# Patient Record
Sex: Female | Born: 1970
Health system: Southern US, Community
[De-identification: ages and names within clinical notes are randomized; demographics above are authoritative.]

## PROBLEM LIST (undated history)

## (undated) DIAGNOSIS — F419 Anxiety disorder, unspecified: Secondary | ICD-10-CM

## (undated) DIAGNOSIS — T7840XA Allergy, unspecified, initial encounter: Secondary | ICD-10-CM

## (undated) DIAGNOSIS — F32A Depression, unspecified: Secondary | ICD-10-CM

## (undated) HISTORY — DX: Allergy, unspecified, initial encounter: T78.40XA

## (undated) HISTORY — DX: Anxiety disorder, unspecified: F41.9

## (undated) HISTORY — PX: WISDOM TOOTH EXTRACTION: SHX21

## (undated) HISTORY — PX: URETHRAL CYST REMOVAL: SHX5128

## (undated) HISTORY — PX: OTHER SURGICAL HISTORY: SHX169

## (undated) HISTORY — DX: Depression, unspecified: F32.A

---

## 2005-06-16 ENCOUNTER — Encounter: Payer: Self-pay | Admitting: Family Medicine

## 2005-06-16 LAB — CONVERTED CEMR LAB

## 2006-06-22 ENCOUNTER — Ambulatory Visit: Payer: Self-pay | Admitting: Family Medicine

## 2006-06-26 ENCOUNTER — Ambulatory Visit: Payer: Self-pay | Admitting: Family Medicine

## 2006-06-28 ENCOUNTER — Ambulatory Visit: Payer: Self-pay | Admitting: Family Medicine

## 2006-08-07 ENCOUNTER — Ambulatory Visit: Payer: Self-pay | Admitting: Family Medicine

## 2007-01-21 ENCOUNTER — Ambulatory Visit: Payer: Self-pay | Admitting: Family Medicine

## 2007-02-04 ENCOUNTER — Telehealth: Payer: Self-pay | Admitting: Family Medicine

## 2007-02-06 ENCOUNTER — Ambulatory Visit: Payer: Self-pay | Admitting: Family Medicine

## 2007-05-24 ENCOUNTER — Ambulatory Visit: Payer: Self-pay | Admitting: Family Medicine

## 2007-05-24 DIAGNOSIS — A630 Anogenital (venereal) warts: Secondary | ICD-10-CM | POA: Insufficient documentation

## 2007-10-04 ENCOUNTER — Ambulatory Visit: Payer: Self-pay | Admitting: Family Medicine

## 2007-10-04 LAB — CONVERTED CEMR LAB: Rapid Strep: NEGATIVE

## 2007-11-20 ENCOUNTER — Ambulatory Visit: Payer: Self-pay | Admitting: Family Medicine

## 2007-11-20 DIAGNOSIS — K644 Residual hemorrhoidal skin tags: Secondary | ICD-10-CM | POA: Insufficient documentation

## 2009-07-21 ENCOUNTER — Ambulatory Visit: Payer: Self-pay | Admitting: Family Medicine

## 2010-07-27 ENCOUNTER — Encounter: Payer: Self-pay | Admitting: Family Medicine

## 2010-11-15 NOTE — Letter (Signed)
Summary: [facility]  [facility]   Imported By: Lanelle Bal 08/11/2010 09:24:05  _____________________________________________________________________  External Attachment:    Type:   Image     Comment:   External Document

## 2010-11-30 ENCOUNTER — Encounter: Payer: Self-pay | Admitting: Family Medicine

## 2010-11-30 ENCOUNTER — Ambulatory Visit (INDEPENDENT_AMBULATORY_CARE_PROVIDER_SITE_OTHER): Payer: BC Managed Care – PPO | Admitting: Family Medicine

## 2010-11-30 DIAGNOSIS — H9209 Otalgia, unspecified ear: Secondary | ICD-10-CM

## 2010-12-07 NOTE — Assessment & Plan Note (Signed)
Summary: Ear pain   Vital Signs:  Patient profile:   40 year old female Height:      67 inches Weight:      143 pounds Temp:     98.0 degrees F oral Pulse rate:   69 / minute BP sitting:   111 / 70  (right arm) Cuff size:   regular  Vitals Entered By: Avon Gully CMA, Duncan Dull) (November 30, 2010 11:27 AM) CC: left ear pain x 2 days   Primary Care Provider:  Linford Arnold, C  CC:  left ear pain x 2 days.  History of Present Illness: Left ear feels sore. Worse later inthe day. Worse at night when lays down at night. No URI. Mild ST. No ear drainage.  No sneezing. No fever Hx of allergies.  Did take a benadrul last night and a sudafed this AM. Not sure if really helped or not.   Current Medications (verified): 1)  Multivitamins Tabs (Multiple Vitamin) .... Take 1 Tablet By Mouth Once A Day 2)  Fish Oil 3)  Vit D 4)  Vit C 5)  Calcium  Allergies (verified): No Known Drug Allergies  Comments:  Nurse/Medical Assistant: The patient's medications and allergies were reviewed with the patient and were updated in the Medication and Allergy Lists. Avon Gully CMA, Duncan Dull) (November 30, 2010 11:28 AM)  Physical Exam  General:  Well-developed,well-nourished,in no acute distress; alert,appropriate and cooperative throughout examination Head:  Normocephalic and atraumatic without obvious abnormalities. No apparent alopecia or balding. Eyes:  No corneal or conjunctival inflammation noted. EOMI. Perrla.  Ears:  External ear exam shows no significant lesions or deformities.  Otoscopic examination reveals clear canals, tympanic membranes are intact bilaterally without bulging, retraction, inflammation or discharge.Left TM with small amout of clear fluid.  Hearing is grossly normal bilaterally. Nose:  External nasal examination shows no deformity or inflammation. Nasal mucosa are pink and moist without lesions or exudates. Mouth:  Oral mucosa and oropharynx without lesions or exudates.   Teeth in good repair. Neck:  No deformities, masses, or tenderness noted. Lungs:  Normal respiratory effort, chest expands symmetrically. Lungs are clear to auscultation, no crackles or wheezes. Heart:  Normal rate and regular rhythm. S1 and S2 normal without gallop, murmur, click, rub or other extra sounds. Pulses:  Raidal 2+  Skin:  no rashes.   Cervical Nodes:  Small right anterior cerv LN.  Psych:  Cognition and judgment appear intact. Alert and cooperative with normal attention span and concentration. No apparent delusions, illusions, hallucinations   Impression & Recommendations:  Problem # 1:  EAR PAIN, LEFT (ICD-388.70) Eustachian tube dysfunction likely related to her allergies. Restart antihistamin and start a nasal steroid spray. Call if not better in one week or if suddently gets worse.   Complete Medication List: 1)  Multivitamins Tabs (Multiple vitamin) .... Take 1 tablet by mouth once a day 2)  Fish Oil  3)  Vit D  4)  Vit C  5)  Calcium  6)  Fluticasone Propionate 50 Mcg/act Susp (Fluticasone propionate) .... 2 sprays in each nostril daily  Patient Instructions: 1)  Trial of the nasal spray. 2 sprays in each nostril daily for 2 weeks 2)  Can also start claritin or zyrtec once a day as well.  3)  Call if gets worse.  Prescriptions: FLUTICASONE PROPIONATE 50 MCG/ACT SUSP (FLUTICASONE PROPIONATE) 2 sprays in each nostril daily  #1 x 0   Entered and Authorized by:   Nani Gasser MD  Signed by:   Nani Gasser MD on 11/30/2010   Method used:   Electronically to        [facility]* (retail)       [address] [place], [place]  [address]       Ph: [phone]       Fax: [phone]   RxID:   [id number]    Orders Added: 1)  Est. Patient Level III [66063]

## 2012-09-27 ENCOUNTER — Ambulatory Visit (INDEPENDENT_AMBULATORY_CARE_PROVIDER_SITE_OTHER): Payer: Managed Care, Other (non HMO) | Admitting: Sports Medicine

## 2012-09-27 DIAGNOSIS — Z111 Encounter for screening for respiratory tuberculosis: Secondary | ICD-10-CM

## 2012-09-27 NOTE — Progress Notes (Signed)
PPD planted today.  I was present for all essential parts of this visit and procedure. Ihor Austin. Benjamin Stain, M.D.

## 2013-01-06 ENCOUNTER — Encounter: Payer: Self-pay | Admitting: Family Medicine

## 2013-01-06 ENCOUNTER — Ambulatory Visit (INDEPENDENT_AMBULATORY_CARE_PROVIDER_SITE_OTHER): Payer: Managed Care, Other (non HMO) | Admitting: Family Medicine

## 2013-01-06 VITALS — BP 110/62 | HR 79 | Temp 97.9°F | Ht 67.0 in | Wt 138.0 lb

## 2013-01-06 DIAGNOSIS — K5289 Other specified noninfective gastroenteritis and colitis: Secondary | ICD-10-CM

## 2013-01-06 DIAGNOSIS — K529 Noninfective gastroenteritis and colitis, unspecified: Secondary | ICD-10-CM

## 2013-01-06 MED ORDER — ONDANSETRON 4 MG PO TBDP: 4.0000 mg | ORAL_TABLET | Freq: Three times a day (TID) | ORAL | Status: DC | PRN

## 2013-01-06 NOTE — Progress Notes (Signed)
  Subjective:    Patient ID: Ruth Garcia, female    DOB: 06-Apr-1971, 42 y.o.   MRN: [id number]  HPI N/V/D started at 1 AM.  Some chills. Took 2 TUMS but no relief.  Took some pepto-bismol.  Stomach and throat is sore.  Some epigastric discomfort.  Took HA.     Review of Systems     Objective:   Physical Exam  Constitutional: She is oriented to person, place, and time. She appears well-developed and well-nourished.  HENT:  Head: Normocephalic and atraumatic.  Right Ear: External ear normal.  Left Ear: External ear normal.  Nose: Nose normal.  Mouth/Throat: Oropharynx is clear and moist.  TMs and canals are clear.   Eyes: Conjunctivae and EOM are normal. Pupils are equal, round, and reactive to light.  Neck: Neck supple. No thyromegaly present.  Cardiovascular: Normal rate, regular rhythm and normal heart sounds.   Pulmonary/Chest: Effort normal and breath sounds normal. She has no wheezes.  Abdominal: Soft. Bowel sounds are normal. She exhibits no distension and no mass. There is tenderness. There is no rebound and no guarding.  Diffuse tenderness.   Musculoskeletal: She exhibits no edema.  Lymphadenopathy:    She has no cervical adenopathy.  Neurological: She is alert and oriented to person, place, and time.  Skin: Skin is warm and dry.  Psychiatric: She has a normal mood and affect.          Assessment & Plan:  Gastroenteritis. Likely viral. Symptoms aren't completely classic. Handout given. Difference in to the pharmacy for as needed for nausea. Encouraged her not to take anything for the diarrhea such as Imodium if they can oftentimes prolonged illness. Make sure it's starting to increase fluids today and eat when able. Advance diet as tolerated. Call if not better in 2-3 days.

## 2013-01-06 NOTE — Patient Instructions (Signed)
Viral Gastroenteritis Viral gastroenteritis is also known as stomach flu. This condition affects the stomach and intestinal tract. It can cause sudden diarrhea and vomiting. The illness typically lasts 3 to 8 days. Most people develop an immune response that eventually gets rid of the virus. While this natural response develops, the virus can make you quite ill. CAUSES  Many different viruses can cause gastroenteritis, such as rotavirus or noroviruses. You can catch one of these viruses by consuming contaminated food or water. You may also catch a virus by sharing utensils or other personal items with an infected person or by touching a contaminated surface. SYMPTOMS  The most common symptoms are diarrhea and vomiting. These problems can cause a severe loss of body fluids (dehydration) and a body salt (electrolyte) imbalance. Other symptoms may include:  Fever.  Headache.  Fatigue.  Abdominal pain. DIAGNOSIS  Your caregiver can usually diagnose viral gastroenteritis based on your symptoms and a physical exam. A stool sample may also be taken to test for the presence of viruses or other infections. TREATMENT  This illness typically goes away on its own. Treatments are aimed at rehydration. The most serious cases of viral gastroenteritis involve vomiting so severely that you are not able to keep fluids down. In these cases, fluids must be given through an intravenous line (IV). HOME CARE INSTRUCTIONS   Drink enough fluids to keep your urine clear or pale yellow. Drink small amounts of fluids frequently and increase the amounts as tolerated.  Ask your caregiver for specific rehydration instructions.  Avoid:  Foods high in sugar.  Alcohol.  Carbonated drinks.  Tobacco.  Juice.  Caffeine drinks.  Extremely hot or cold fluids.  Fatty, greasy foods.  Too much intake of anything at one time.  Dairy products until 24 to 48 hours after diarrhea stops.  You may consume probiotics.  Probiotics are active cultures of beneficial bacteria. They may lessen the amount and number of diarrheal stools in adults. Probiotics can be found in yogurt with active cultures and in supplements.  Wash your hands well to avoid spreading the virus.  Only take over-the-counter or prescription medicines for pain, discomfort, or fever as directed by your caregiver. Do not give aspirin to children. Antidiarrheal medicines are not recommended.  Ask your caregiver if you should continue to take your regular prescribed and over-the-counter medicines.  Keep all follow-up appointments as directed by your caregiver. SEEK IMMEDIATE MEDICAL CARE IF:   You are unable to keep fluids down.  You do not urinate at least once every 6 to 8 hours.  You develop shortness of breath.  You notice blood in your stool or vomit. This may look like coffee grounds.  You have abdominal pain that increases or is concentrated in one small area (localized).  You have persistent vomiting or diarrhea.  You have a fever.  The patient is a child younger than 3 months, and he or she has a fever.  The patient is a child older than 3 months, and he or she has a fever and persistent symptoms.  The patient is a child older than 3 months, and he or she has a fever and symptoms suddenly get worse.  The patient is a baby, and he or she has no tears when crying. MAKE SURE YOU:   Understand these instructions.  Will watch your condition.  Will get help right away if you are not doing well or get worse. Document Released: 10/02/2005 Document Revised: 12/25/2011 Document Reviewed: 07/19/2011   ExitCare Patient Information 2013 [employer].  

## 2013-08-20 ENCOUNTER — Encounter: Payer: Self-pay | Admitting: Internal Medicine

## 2013-08-20 ENCOUNTER — Encounter: Payer: BC Managed Care – PPO | Admitting: Internal Medicine

## 2013-08-20 ENCOUNTER — Ambulatory Visit (INDEPENDENT_AMBULATORY_CARE_PROVIDER_SITE_OTHER): Payer: BC Managed Care – PPO | Admitting: Internal Medicine

## 2013-08-20 VITALS — BP 108/63 | HR 59 | Temp 98.3°F | Resp 16 | Wt 137.0 lb

## 2013-08-20 DIAGNOSIS — Z803 Family history of malignant neoplasm of breast: Secondary | ICD-10-CM | POA: Insufficient documentation

## 2013-08-20 DIAGNOSIS — Z139 Encounter for screening, unspecified: Secondary | ICD-10-CM

## 2013-08-20 NOTE — Progress Notes (Signed)
  Subjective:    Patient ID: Ruth Garcia, female    DOB: 11-23-1970, 42 y.o.   MRN: [id number]  HPI  Ruth Garcia is a new pt here to establish care.  She is healthy,   A marathon runner  PMH of condyloma on elbow (Dr. Polo Riley) ,  Strong [facility] breast cancer in 2 maternal aunts , a counsin.  One maternal aunt has ovarian cancer.    She is due for her mm now.  Last one done [facility]   No Known Allergies History reviewed. No pertinent past medical history. History reviewed. No pertinent past surgical history. History   Social History  . Marital Status: Married    Spouse Name: N/A    Number of Children: N/A  . Years of Education: N/A   Occupational History  . Not on file.   Social History Main Topics  . Smoking status: Current Some Day Smoker  . Smokeless tobacco: Not on file  . Alcohol Use: 0.6 oz/week    1 Glasses of wine per week     Comment: WEEKLY  . Drug Use: No  . Sexual Activity: Yes    Birth Control/ Protection: Surgical   Other Topics Concern  . Not on file   Social History Narrative  . No narrative on file   Family History  Problem Relation Age of Onset  . Thyroid disease Mother   . Thyroid disease Daughter    Patient Active Problem List   Diagnosis Date Noted  . Family history of breast cancer 08/20/2013  . EAR PAIN, LEFT 11/30/2010  . EXTERNAL HEMORRHOIDS WITH OTHER COMPLICATION 11/20/2007  . CONDYLOMA ACUMINATUM 05/24/2007   No current outpatient prescriptions on file prior to visit.   No current facility-administered medications on file prior to visit.      Review of Systems See HPI    Objective:   Physical Exam Physical Exam  Nursing note and vitals reviewed.  Constitutional: She is oriented to person, place, and time. She appears well-developed and well-nourished.  HENT:  Head: Normocephalic and atraumatic.  Cardiovascular: Normal rate and regular rhythm. Exam reveals no gallop and no friction rub.  No murmur heard.   Pulmonary/Chest: Breath sounds normal. She has no wheezes. She has no rales.  Neurological: She is alert and oriented to person, place, and time.  Skin: Skin is warm and dry.  Psychiatric: She has a normal mood and affect. Her behavior is normal.          Assessment & Plan:  FH breast cancer.  I gave pt number to genetic counselor at [facility]  She tells me cousin was BRCA negative.  Will schedule 3-D mm  Condyloma  [place] dermat[place]  Dr. Polo Riley  Get labs  CPE with me

## 2013-08-20 NOTE — Patient Instructions (Signed)
Will set up 3D mm at the breast center  See me in 2015 for A CPE  Blood work today

## 2013-08-21 ENCOUNTER — Ambulatory Visit
Admission: RE | Admit: 2013-08-21 | Discharge: 2013-08-21 | Disposition: A | Discharge status: Home / Self Care | Payer: BC Managed Care – PPO | Source: Ambulatory Visit | Attending: Internal Medicine | Admitting: Internal Medicine

## 2013-08-21 ENCOUNTER — Other Ambulatory Visit: Payer: Self-pay | Admitting: Internal Medicine

## 2013-08-21 ENCOUNTER — Ambulatory Visit: Admission: RE | Admit: 2013-08-21 | Payer: BC Managed Care – PPO | Source: Ambulatory Visit

## 2013-08-21 DIAGNOSIS — Z139 Encounter for screening, unspecified: Secondary | ICD-10-CM

## 2013-08-21 LAB — CBC WITH DIFFERENTIAL/PLATELET
Basophils Absolute: 0 10*3/uL (ref 0.0–0.1)
Basophils Relative: 1 % (ref 0–1)
Eosinophils Absolute: 0.1 10*3/uL (ref 0.0–0.7)
HCT: 40.4 % (ref 36.0–46.0)
Hemoglobin: 13.7 g/dL (ref 12.0–15.0)
Lymphocytes Relative: 33 % (ref 12–46)
Lymphs Abs: 1.4 10*3/uL (ref 0.7–4.0)
MCV: 92.4 fL (ref 78.0–100.0)
Monocytes Relative: 11 % (ref 3–12)
Platelets: 257 10*3/uL (ref 150–400)
RBC: 4.37 MIL/uL (ref 3.87–5.11)
WBC: 4.3 10*3/uL (ref 4.0–10.5)

## 2013-08-21 LAB — LIPID PANEL
HDL: 63 mg/dL (ref >39)
Total CHOL/HDL Ratio: 2.6 Ratio

## 2013-08-21 LAB — COMPREHENSIVE METABOLIC PANEL
ALT: 26 U/L (ref 0–35)
AST: 20 U/L (ref 0–37)
Albumin: 4.1 g/dL (ref 3.5–5.2)
Alkaline Phosphatase: 44 U/L (ref 39–117)
BUN: 15 mg/dL (ref 6–23)
Calcium: 9.4 mg/dL (ref 8.4–10.5)
Chloride: 102 mEq/L (ref 96–112)
Creat: 0.77 mg/dL (ref 0.50–1.10)

## 2013-08-22 LAB — TSH: TSH: 4.334 u[IU]/mL (ref 0.350–4.500)

## 2013-08-22 LAB — VITAMIN D 25 HYDROXY (VIT D DEFICIENCY, FRACTURES): Vit D, 25-Hydroxy: 67 ng/mL (ref 30–89)

## 2013-08-25 ENCOUNTER — Encounter: Payer: Self-pay | Admitting: *Deleted

## 2013-08-27 ENCOUNTER — Encounter: Payer: Self-pay | Admitting: Internal Medicine

## 2013-08-28 ENCOUNTER — Other Ambulatory Visit: Payer: Self-pay | Admitting: *Deleted

## 2013-08-28 ENCOUNTER — Encounter: Payer: Self-pay | Admitting: *Deleted

## 2013-10-07 ENCOUNTER — Encounter: Payer: Self-pay | Admitting: Internal Medicine

## 2013-10-07 ENCOUNTER — Ambulatory Visit (INDEPENDENT_AMBULATORY_CARE_PROVIDER_SITE_OTHER): Payer: BC Managed Care – PPO | Admitting: Internal Medicine

## 2013-10-07 VITALS — BP 121/63 | HR 74 | Temp 98.2°F | Resp 18 | Wt 138.0 lb

## 2013-10-07 DIAGNOSIS — J029 Acute pharyngitis, unspecified: Secondary | ICD-10-CM

## 2013-10-07 DIAGNOSIS — R059 Cough, unspecified: Secondary | ICD-10-CM

## 2013-10-07 DIAGNOSIS — R05 Cough: Secondary | ICD-10-CM

## 2013-10-07 MED ORDER — AZITHROMYCIN 250 MG PO TABS: ORAL_TABLET | ORAL | Status: DC

## 2013-10-07 NOTE — Progress Notes (Signed)
   Subjective:    Patient ID: Ruth Garcia, female    DOB: [birth year], 42 y.o.   MRN: [id number]  HPI  Ruth Garcia presents with sore throat, sinus pressure for several days.  NO documented fever no chest pain no SOB.  Exposure to daughter who was diagnosed with pharyngitis and has throat cultrue pending.Review of Systems    See HPI Objective:   Physical Exam Physical Exam  Constitutional: She is oriented to person, place, and time. She appears well-developed and well-nourished. She is cooperative.  HENT:  Head: Normocephalic and atraumatic.  Right Ear: A middle ear effusion is present.  Left Ear: A middle ear effusion is present.  Nose: Mucosal edema present.  Mouth/Throat: Oropharyngeal exudate and posterior oropharyngeal erythema present.  Serous effusion bilaterally  Eyes: Conjunctivae and EOM are normal. Pupils are equal, round, and reactive to light.  Neck: Neck supple. Carotid bruit is not present. No mass present.  Cardiovascular: Regular rhythm, normal heart sounds, intact distal pulses and normal pulses. Exam reveals no gallop and no friction rub.  No murmur heard.  Pulmonary/Chest: Breath sounds normal. She has no wheezes. She has no rhonchi. She has no rales.  Lymphadenopathy:  She has cervical adenopathy.  Neurological: She is alert and oriented to person, place, and time.  Skin: Skin is warm and dry. No abrasion, no bruising, no ecchymosis and no rash noted. No cyanosis. Nails show no clubbing.  Psychiatric: She has a normal mood and affect. Her speech is normal and behavior is normal.            Assessment & Plan:  Pharyngitis  Z Pak to be taken only if symptoms worsen or her daughter is strep positive.   Cough  OTC Delsym Return if not better

## 2013-12-17 ENCOUNTER — Ambulatory Visit (INDEPENDENT_AMBULATORY_CARE_PROVIDER_SITE_OTHER): Payer: BC Managed Care – PPO | Admitting: Internal Medicine

## 2013-12-17 ENCOUNTER — Encounter: Payer: Self-pay | Admitting: Internal Medicine

## 2013-12-17 VITALS — BP 108/56 | HR 62 | Temp 98.1°F | Resp 18 | Wt 141.0 lb

## 2013-12-17 DIAGNOSIS — Z0000 Encounter for general adult medical examination without abnormal findings: Secondary | ICD-10-CM

## 2013-12-17 DIAGNOSIS — Z23 Encounter for immunization: Secondary | ICD-10-CM

## 2013-12-17 DIAGNOSIS — Z124 Encounter for screening for malignant neoplasm of cervix: Secondary | ICD-10-CM

## 2013-12-17 DIAGNOSIS — Z1151 Encounter for screening for human papillomavirus (HPV): Secondary | ICD-10-CM

## 2013-12-17 NOTE — Progress Notes (Signed)
Subjective:    Patient ID: Ruth Garcia, female    DOB: Apr 23, 1971, 43 y.o.   MRN: [id number]  HPI  Ruth Garcia is here for CPE  Very healthy  Marathon runner.  Former Pharmacist, hospital.    No chest tightness or pain when running  Fh breast Cancer:  2 maternal aunts.  Pts mother just tested for BRCA  Results pending.  MM 08/2013 neg.   No Known Allergies History reviewed. No pertinent past medical history. History reviewed. No pertinent past surgical history. History   Social History  . Marital Status: Married    Spouse Name: N/A    Number of Children: N/A  . Years of Education: N/A   Occupational History  . Not on file.   Social History Main Topics  . Smoking status: Never Smoker   . Smokeless tobacco: Never Used  . Alcohol Use: 0.6 oz/week    1 Glasses of wine per week     Comment: WEEKLY  . Drug Use: No  . Sexual Activity: Yes    Birth Control/ Protection: Surgical   Other Topics Concern  . Not on file   Social History Narrative  . No narrative on file   Family History  Problem Relation Age of Onset  . Thyroid disease Mother   . Thyroid disease Daughter   . Cancer Maternal Aunt     2 with breast and one with ovarian  . Cancer Maternal Uncle     colon   Patient Active Problem List   Diagnosis Date Noted  . Family history of breast cancer 08/20/2013  . EAR PAIN, LEFT 11/30/2010  . EXTERNAL HEMORRHOIDS WITH OTHER COMPLICATION 03/83/3383  . CONDYLOMA ACUMINATUM 05/24/2007   Current Outpatient Prescriptions on File Prior to Visit  Medication Sig Dispense Refill  . Calcium Carbonate-Vitamin D (CALCIUM + D PO) Take 1 tablet by mouth daily.      . fish oil-omega-3 fatty acids 1000 MG capsule Take 2 g by mouth daily.      . Multiple Vitamin (MULTIVITAMIN) capsule Take 1 capsule by mouth daily.      . vitamin C (ASCORBIC ACID) 500 MG tablet Take 500 mg by mouth daily.       No current facility-administered medications on file prior to visit.     Review of Systems    Constitutional: Negative for unexpected weight change.  Respiratory: Negative for chest tightness and shortness of breath.   Cardiovascular: Negative for chest pain, palpitations and leg swelling.  All other systems reviewed and are negative.       Objective:   Physical Exam  Physical Exam  Vital signs and nursing note reviewed  Constitutional: She is oriented to person, place, and time. She appears well-developed and well-nourished. She is cooperative.  HENT:  Head: Normocephalic and atraumatic.  Right Ear: Tympanic membrane normal.  Left Ear: Tympanic membrane normal.  Nose: Nose normal.  Mouth/Throat: Oropharynx is clear and moist and mucous membranes are normal. No oropharyngeal exudate or posterior oropharyngeal erythema.  Eyes: Conjunctivae and EOM are normal. Pupils are equal, round, and reactive to light.  Neck: Neck supple. No JVD present. Carotid bruit is not present. No mass and no thyromegaly present.  Cardiovascular: Regular rhythm, normal heart sounds, intact distal pulses and normal pulses.  Exam reveals no gallop and no friction rub.   No murmur heard. Pulses:      Dorsalis pedis pulses are 2+ on the right side, and 2+ on the left side.  Pulmonary/Chest:  Breath sounds normal. She has no wheezes. She has no rhonchi. She has no rales. Right breast exhibits no mass, no nipple discharge and no skin change. Left breast exhibits no mass, no nipple discharge and no skin change.  Abdominal: Soft. Bowel sounds are normal. She exhibits no distension and no mass. There is no hepatosplenomegaly. There is no tenderness. There is no CVA tenderness.  Genitourinary: Rectum normal, vagina normal and uterus normal. Rectal exam shows no mass. Guaiac negative stool. No labial fusion. There is no lesion on the right labia. There is no lesion on the left labia. Cervix exhibits no motion tenderness. Right adnexum displays no mass, no tenderness and no fullness. Left adnexum displays no mass,  no tenderness and no fullness. No erythema around the vagina.  Musculoskeletal:       No active synovitis to any joint.    Lymphadenopathy:       Right cervical: No superficial cervical adenopathy present.      Left cervical: No superficial cervical adenopathy present.       Right axillary: No pectoral and no lateral adenopathy present.       Left axillary: No pectoral and no lateral adenopathy present.      Right: No inguinal adenopathy present.       Left: No inguinal adenopathy present.  Neurological: She is alert and oriented to person, place, and time. She has normal strength and normal reflexes. No cranial nerve deficit or sensory deficit. She displays a negative Romberg sign. Coordination and gait normal.  Skin: Skin is warm and dry. No abrasion, no bruising, no ecchymosis and no rash noted. No cyanosis. Nails show no clubbing.  Psychiatric: She has a normal mood and affect. Her speech is normal and behavior is normal.       Assessment & Plan:  Health Maintenance:   Will give Tdap today.   Pap today Advised  Eye exam  See scanned sheet. HM check list given  FH breast cancer   Mother BRCA pending  Info given to call for telephone counseling     Condyloma  Pt has dermatologist.   See me as needed         Assessment & Plan:

## 2013-12-21 ENCOUNTER — Encounter: Payer: Self-pay | Admitting: Internal Medicine

## 2014-08-05 ENCOUNTER — Other Ambulatory Visit: Payer: Self-pay

## 2014-08-05 DIAGNOSIS — Z1231 Encounter for screening mammogram for malignant neoplasm of breast: Secondary | ICD-10-CM

## 2014-08-17 ENCOUNTER — Encounter: Payer: Self-pay | Admitting: Internal Medicine

## 2014-08-28 ENCOUNTER — Ambulatory Visit
Admission: RE | Admit: 2014-08-28 | Discharge: 2014-08-28 | Disposition: A | Discharge status: Home / Self Care | Payer: BC Managed Care – PPO | Source: Ambulatory Visit

## 2014-08-28 DIAGNOSIS — Z1231 Encounter for screening mammogram for malignant neoplasm of breast: Secondary | ICD-10-CM

## 2015-01-12 ENCOUNTER — Other Ambulatory Visit: Payer: Self-pay | Admitting: *Deleted

## 2015-01-12 DIAGNOSIS — Z0000 Encounter for general adult medical examination without abnormal findings: Secondary | ICD-10-CM

## 2015-01-13 ENCOUNTER — Ambulatory Visit (INDEPENDENT_AMBULATORY_CARE_PROVIDER_SITE_OTHER): Payer: BLUE CROSS/BLUE SHIELD | Admitting: Internal Medicine

## 2015-01-13 ENCOUNTER — Encounter: Payer: Self-pay | Admitting: Internal Medicine

## 2015-01-13 VITALS — BP 113/68 | HR 58 | Resp 16 | Ht 66.5 in | Wt 142.0 lb

## 2015-01-13 DIAGNOSIS — Z0000 Encounter for general adult medical examination without abnormal findings: Secondary | ICD-10-CM

## 2015-01-13 DIAGNOSIS — Z803 Family history of malignant neoplasm of breast: Secondary | ICD-10-CM | POA: Diagnosis not present

## 2015-01-13 LAB — POCT URINALYSIS DIPSTICK
Bilirubin, UA: NEGATIVE
Blood, UA: NEGATIVE
Glucose, UA: NEGATIVE
Ketones, UA: NEGATIVE
Leukocytes, UA: NEGATIVE
Nitrite, UA: NEGATIVE
Protein, UA: NEGATIVE
Spec Grav, UA: 1.01
Urobilinogen, UA: NEGATIVE
pH, UA: 6.5

## 2015-01-13 LAB — COMPLETE METABOLIC PANEL WITHOUT GFR
ALT: 30 U/L (ref 0–35)
AST: 22 U/L (ref 0–37)
Albumin: 4.4 g/dL (ref 3.5–5.2)
Alkaline Phosphatase: 45 U/L (ref 39–117)
BUN: 18 mg/dL (ref 6–23)
CO2: 30 meq/L (ref 19–32)
Calcium: 10 mg/dL (ref 8.4–10.5)
Chloride: 100 meq/L (ref 96–112)
Creat: 0.68 mg/dL (ref 0.50–1.10)
GFR, Est African American: >89 mL/min
GFR, Est Non African American: >89 mL/min
Glucose, Bld: 89 mg/dL (ref 70–99)
Potassium: 4.7 meq/L (ref 3.5–5.3)
Sodium: 140 meq/L (ref 135–145)
Total Bilirubin: 0.8 mg/dL (ref 0.2–1.2)
Total Protein: 7.7 g/dL (ref 6.0–8.3)

## 2015-01-13 LAB — LIPID PANEL
Cholesterol: 184 mg/dL (ref 0–200)
HDL: 62 mg/dL (ref >=46)
LDL Cholesterol: 108 mg/dL — ABNORMAL HIGH (ref 0–99)
Total CHOL/HDL Ratio: 3 Ratio
Triglycerides: 71 mg/dL (ref <150)
VLDL: 14 mg/dL (ref 0–40)

## 2015-01-13 LAB — TSH: TSH: 3.606 u[IU]/mL (ref 0.350–4.500)

## 2015-01-13 NOTE — Progress Notes (Signed)
Subjective:    Patient ID: Ruth Garcia, female    DOB: 1971/05/20, 44 y.o.   MRN: [id number]  HPI 12/2013 note Assessment & Plan:  Health Maintenance: Will give Tdap today. Pap today Advised Eye exam See scanned sheet. HM check list given  FH breast cancer Mother BRCA pending Info given to call for telephone counseling   Condyloma Pt has dermatologist.   See me as needed        TODAy  Ruth Garcia is here for CPE  HM:  UTD with mm, declines flu vaccine   UTD with TDAP   Pap neg 2015 reports she has never had an abnormal pap  One episode of early menses just this month .  7 days duration with a few clots   2 weeks later another cycle but not as heavy   No Known Allergies No past medical history on file. No past surgical history on file. History   Social History  . Marital Status: Married    Spouse Name: N/A  . Number of Children: N/A  . Years of Education: N/A   Occupational History  . Not on file.   Social History Main Topics  . Smoking status: Never Smoker   . Smokeless tobacco: Never Used  . Alcohol Use: 0.6 oz/week    1 Glasses of wine per week     Comment: WEEKLY  . Drug Use: No  . Sexual Activity: Yes    Birth Control/ Protection: Surgical   Other Topics Concern  . Not on file   Social History Narrative   Family History  Problem Relation Age of Onset  . Thyroid disease Mother   . Thyroid disease Daughter   . Cancer Maternal Aunt     2 with breast and one with ovarian  . Cancer Maternal Uncle     colon   Patient Active Problem List   Diagnosis Date Noted  . Family history of breast cancer 08/20/2013  . EAR PAIN, LEFT 11/30/2010  . EXTERNAL HEMORRHOIDS WITH OTHER COMPLICATION 27/51/7001  . CONDYLOMA ACUMINATUM 05/24/2007   Current Outpatient Prescriptions on File Prior to Visit  Medication Sig Dispense Refill  . Calcium Carbonate-Vitamin D (CALCIUM + D PO) Take 1 tablet by mouth daily.    . fish oil-omega-3 fatty acids 1000 MG  capsule Take 2 g by mouth daily.    Marland Kitchen glucosamine-chondroitin 500-400 MG tablet Take 1 tablet by mouth 3 (three) times daily.    . Multiple Vitamin (MULTIVITAMIN) capsule Take 1 capsule by mouth daily.    . vitamin C (ASCORBIC ACID) 500 MG tablet Take 500 mg by mouth daily.     No current facility-administered medications on file prior to visit.      Review of Systems  Respiratory: Negative for cough, chest tightness, shortness of breath and wheezing.   Cardiovascular: Negative for chest pain, palpitations and leg swelling.  All other systems reviewed and are negative.      Objective:   Physical Exam   Physical Exam  Nursing note and vitals reviewed.  Constitutional: She is oriented to person, place, and time. She appears well-developed and well-nourished.  HENT:  Head: Normocephalic and atraumatic.  Right Ear: Tympanic membrane and ear canal normal. No drainage. Tympanic membrane is not injected and not erythematous.  Left Ear: Tympanic membrane and ear canal normal. No drainage. Tympanic membrane is not injected and not erythematous.  Nose: Nose normal. Right sinus exhibits no maxillary sinus tenderness and no frontal sinus tenderness.  Left sinus exhibits no maxillary sinus tenderness and no frontal sinus tenderness.  Mouth/Throat: Oropharynx is clear and moist. No oral lesions. No oropharyngeal exudate.  Eyes: Conjunctivae and EOM are normal. Pupils are equal, round, and reactive to light.  Neck: Normal range of motion. Neck supple. No JVD present. Carotid bruit is not present. No mass and no thyromegaly present.  Cardiovascular: Normal rate, regular rhythm, S1 normal, S2 normal and intact distal pulses. Exam reveals no gallop and no friction rub.  No murmur heard.  Pulses:  Carotid pulses are 2+ on the right side, and 2+ on the left side.  Dorsalis pedis pulses are 2+ on the right side, and 2+ on the left side.  No carotid bruit. No LE edema  Pulmonary/Chest: Breath sounds  normal. She has no wheezes. She has no rales. She exhibits no tenderness.  Breast no discrete mass no nipple discharge no axillary adenopathy bilaterally Abdominal: Soft. Bowel sounds are normal. She exhibits no distension and no mass. There is no hepatosplenomegaly. There is no tenderness. There is no CVA tenderness.  Musculoskeletal: Normal range of motion.  No active synovitis to joints.  Lymphadenopathy:  She has no cervical adenopathy.  She has no axillary adenopathy.  Right: No inguinal and no supraclavicular adenopathy present.  Left: No inguinal and no supraclavicular adenopathy present.  Neurological: She is alert and oriented to person, place, and time. She has normal strength and normal reflexes. She displays no tremor. No cranial nerve deficit or sensory deficit. Coordination and gait normal.  Skin: Skin is warm and dry. No rash noted. No cyanosis. Nails show no clubbing.  Psychiatric: She has a normal mood and affect. Her speech is normal and behavior is normal. Cognition and memory are normal.           Assessment & Plan:  HM  See scanned sheet   FH breast CA   Pt reports mother and sister are both BRCA neg   Irregular menses I advised TVUS but pt declines.  Will check TSh today   Pt advised of my departure and will be receiving letter for alternative PCP options

## 2015-01-14 LAB — CBC WITH DIFFERENTIAL/PLATELET
Basophils Absolute: 0 10*3/uL (ref 0.0–0.1)
Basophils Relative: 1 % (ref 0–1)
EOS PCT: 2 % (ref 0–5)
Eosinophils Absolute: 0.1 10*3/uL (ref 0.0–0.7)
HCT: 43.3 % (ref 36.0–46.0)
HEMOGLOBIN: 14.1 g/dL (ref 12.0–15.0)
LYMPHS ABS: 1.3 10*3/uL (ref 0.7–4.0)
Lymphocytes Relative: 27 % (ref 12–46)
MCH: 31 pg (ref 26.0–34.0)
MCHC: 32.6 g/dL (ref 30.0–36.0)
MCV: 95.2 fL (ref 78.0–100.0)
MPV: 11.4 fL (ref 8.6–12.4)
Monocytes Absolute: 0.5 10*3/uL (ref 0.1–1.0)
Monocytes Relative: 10 % (ref 3–12)
NEUTROS ABS: 2.8 10*3/uL (ref 1.7–7.7)
NEUTROS PCT: 60 % (ref 43–77)
Platelets: 289 10*3/uL (ref 150–400)
RBC: 4.55 MIL/uL (ref 3.87–5.11)
RDW: 13.2 % (ref 11.5–15.5)
WBC: 4.7 10*3/uL (ref 4.0–10.5)

## 2015-01-14 LAB — VITAMIN D 25 HYDROXY (VIT D DEFICIENCY, FRACTURES): Vit D, 25-Hydroxy: 56 ng/mL (ref 30–100)

## 2015-07-23 ENCOUNTER — Other Ambulatory Visit: Payer: Self-pay

## 2015-07-23 DIAGNOSIS — Z1231 Encounter for screening mammogram for malignant neoplasm of breast: Secondary | ICD-10-CM

## 2015-08-31 ENCOUNTER — Ambulatory Visit
Admission: RE | Admit: 2015-08-31 | Discharge: 2015-08-31 | Disposition: A | Discharge status: Home / Self Care | Payer: BLUE CROSS/BLUE SHIELD | Source: Ambulatory Visit

## 2015-08-31 DIAGNOSIS — Z1231 Encounter for screening mammogram for malignant neoplasm of breast: Secondary | ICD-10-CM

## 2015-09-01 ENCOUNTER — Other Ambulatory Visit: Payer: Self-pay | Admitting: Family Medicine

## 2015-09-01 DIAGNOSIS — R928 Other abnormal and inconclusive findings on diagnostic imaging of breast: Secondary | ICD-10-CM

## 2015-09-02 ENCOUNTER — Ambulatory Visit
Admission: RE | Admit: 2015-09-02 | Discharge: 2015-09-02 | Disposition: A | Discharge status: Home / Self Care | Payer: BLUE CROSS/BLUE SHIELD | Source: Ambulatory Visit | Attending: Family Medicine | Admitting: Family Medicine

## 2015-09-02 DIAGNOSIS — R928 Other abnormal and inconclusive findings on diagnostic imaging of breast: Secondary | ICD-10-CM

## 2015-09-03 ENCOUNTER — Other Ambulatory Visit: Payer: BLUE CROSS/BLUE SHIELD

## 2016-03-23 ENCOUNTER — Encounter: Payer: Self-pay | Admitting: Family Medicine

## 2016-03-23 ENCOUNTER — Ambulatory Visit (INDEPENDENT_AMBULATORY_CARE_PROVIDER_SITE_OTHER): Payer: BLUE CROSS/BLUE SHIELD | Admitting: Family Medicine

## 2016-03-23 VITALS — BP 128/55 | HR 72 | Wt 144.0 lb

## 2016-03-23 DIAGNOSIS — Z0000 Encounter for general adult medical examination without abnormal findings: Secondary | ICD-10-CM | POA: Diagnosis not present

## 2016-03-23 DIAGNOSIS — Z114 Encounter for screening for human immunodeficiency virus [HIV]: Secondary | ICD-10-CM

## 2016-03-23 DIAGNOSIS — Z8349 Family history of other endocrine, nutritional and metabolic diseases: Secondary | ICD-10-CM | POA: Diagnosis not present

## 2016-03-23 NOTE — Patient Instructions (Signed)
For gynecology care consider Dr. Veneda Melter or Dr. Clovia Cuff down the hall. For dermatology consider Dr. Justin Mend office. There is a nice PA there named Georgina Peer  Keep up a regular exercise program and make sure you are eating a healthy diet Try to eat 4 servings of dairy a day, or if you are lactose intolerant take a calcium with vitamin D daily.  Your vaccines are up to date.

## 2016-03-23 NOTE — Progress Notes (Signed)
  Subjective:     Ruth Garcia is a 45 y.o. female and is here for a comprehensive physical exam. The patient reports problems - she feels like she is getting a cystocele again and nees a new gyn referral. Her gyn left the area. .  Social History   Social History  . Marital Status: Married    Spouse Name: N/A  . Number of Children: N/A  . Years of Education: N/A   Occupational History  . Not on file.   Social History Main Topics  . Smoking status: Never Smoker   . Smokeless tobacco: Never Used  . Alcohol Use: 0.6 oz/week    1 Glasses of wine per week     Comment: WEEKLY  . Drug Use: No  . Sexual Activity: Yes    Birth Control/ Protection: Surgical   Other Topics Concern  . Not on file   Social History Narrative   Health Maintenance  Topic Date Due  . HIV Screening  11/28/1984  . INFLUENZA VACCINE  05/16/2016  . MAMMOGRAM  08/30/2016  . PAP SMEAR  12/18/2018  . TETANUS/TDAP  12/18/2023    The following portions of the patient's history were reviewed and updated as appropriate: allergies, current medications, past family history, past medical history, past social history, past surgical history and problem list.  Review of Systems A comprehensive review of systems was negative.   Objective:    BP 128/55 mmHg  Pulse 72  Wt 144 lb (65.318 kg)  SpO2 100% General appearance: alert, cooperative and appears stated age Head: Normocephalic, without obvious abnormality, atraumatic Eyes: conj clear, EOMI, PEERLA Ears: normal TM's and external ear canals both ears Nose: Nares normal. Septum midline. Mucosa normal. No drainage or sinus tenderness. Throat: lips, mucosa, and tongue normal; teeth and gums normal Neck: no adenopathy, no carotid bruit, no JVD, supple, symmetrical, trachea midline and thyroid not enlarged, symmetric, no tenderness/mass/nodules Back: symmetric, no curvature. ROM normal. No CVA tenderness. Lungs: clear to auscultation bilaterally Heart: regular  rate and rhythm, S1, S2 normal, no murmur, click, rub or gallop Abdomen: soft, non-tender; bowel sounds normal; no masses,  no organomegaly Extremities: extremities normal, atraumatic, no cyanosis or edema Pulses: 2+ and symmetric Skin: Skin color, texture, turgor normal. No rashes or lesions Lymph nodes: Cervical adenopathy: nl and Axillary adenopathy: nl Neurologic: Alert and oriented X 3, normal strength and tone. Normal symmetric reflexes. Normal coordination and gait    Assessment:    Healthy female exam.      Plan:     See After Visit Summary for Counseling Recommendations  Keep up a regular exercise program and make sure you are eating a healthy diet Try to eat 4 servings of dairy a day, or if you are lactose intolerant take a calcium with vitamin D daily.  Your vaccines are up to date.   Recommendations given for referral to gynecology and dermatology.

## 2016-04-21 DIAGNOSIS — Z1159 Encounter for screening for other viral diseases: Secondary | ICD-10-CM | POA: Diagnosis not present

## 2016-04-21 DIAGNOSIS — Z114 Encounter for screening for human immunodeficiency virus [HIV]: Secondary | ICD-10-CM | POA: Diagnosis not present

## 2016-04-21 DIAGNOSIS — Z8349 Family history of other endocrine, nutritional and metabolic diseases: Secondary | ICD-10-CM | POA: Diagnosis not present

## 2016-04-21 DIAGNOSIS — Z0000 Encounter for general adult medical examination without abnormal findings: Secondary | ICD-10-CM | POA: Diagnosis not present

## 2016-04-22 LAB — LIPID PANEL
CHOL/HDL RATIO: 2.5 ratio (ref <=5.0)
CHOLESTEROL: 159 mg/dL (ref 125–200)
HDL: 63 mg/dL (ref >=46)
LDL Cholesterol: 86 mg/dL (ref <130)
Triglycerides: 49 mg/dL (ref <150)
VLDL: 10 mg/dL (ref <30)

## 2016-04-22 LAB — COMPLETE METABOLIC PANEL WITH GFR
ALBUMIN: 4.1 g/dL (ref 3.6–5.1)
ALK PHOS: 44 U/L (ref 33–115)
ALT: 29 U/L (ref 6–29)
AST: 19 U/L (ref 10–35)
BILIRUBIN TOTAL: 0.8 mg/dL (ref 0.2–1.2)
BUN: 15 mg/dL (ref 7–25)
CALCIUM: 9.1 mg/dL (ref 8.6–10.2)
CO2: 25 mmol/L (ref 20–31)
Chloride: 104 mmol/L (ref 98–110)
Creat: 0.9 mg/dL (ref 0.50–1.10)
GFR, EST AFRICAN AMERICAN: 89 mL/min (ref >=60)
GFR, EST NON AFRICAN AMERICAN: 77 mL/min (ref >=60)
Glucose, Bld: 90 mg/dL (ref 65–99)
POTASSIUM: 4.6 mmol/L (ref 3.5–5.3)
Sodium: 140 mmol/L (ref 135–146)
TOTAL PROTEIN: 6.9 g/dL (ref 6.1–8.1)

## 2016-04-22 LAB — HIV ANTIBODY (ROUTINE TESTING W REFLEX): HIV 1&2 Ab, 4th Generation: NONREACTIVE

## 2016-04-22 LAB — TSH: TSH: 4.15 m[IU]/L

## 2016-05-10 ENCOUNTER — Encounter: Payer: BLUE CROSS/BLUE SHIELD | Admitting: Obstetrics & Gynecology

## 2016-05-22 DIAGNOSIS — F4323 Adjustment disorder with mixed anxiety and depressed mood: Secondary | ICD-10-CM | POA: Diagnosis not present

## 2016-05-24 ENCOUNTER — Other Ambulatory Visit: Payer: Self-pay | Admitting: Obstetrics & Gynecology

## 2016-05-24 ENCOUNTER — Ambulatory Visit (INDEPENDENT_AMBULATORY_CARE_PROVIDER_SITE_OTHER): Payer: BLUE CROSS/BLUE SHIELD | Admitting: Obstetrics & Gynecology

## 2016-05-24 ENCOUNTER — Encounter: Payer: Self-pay | Admitting: Obstetrics & Gynecology

## 2016-05-24 VITALS — BP 127/74 | HR 78 | Resp 16 | Ht 66.0 in | Wt 137.0 lb

## 2016-05-24 DIAGNOSIS — Z1231 Encounter for screening mammogram for malignant neoplasm of breast: Secondary | ICD-10-CM

## 2016-05-24 DIAGNOSIS — N898 Other specified noninflammatory disorders of vagina: Secondary | ICD-10-CM

## 2016-05-24 DIAGNOSIS — Z803 Family history of malignant neoplasm of breast: Secondary | ICD-10-CM

## 2016-05-24 DIAGNOSIS — N899 Noninflammatory disorder of vagina, unspecified: Secondary | ICD-10-CM | POA: Diagnosis not present

## 2016-05-24 NOTE — Progress Notes (Signed)
   Subjective:    Patient ID: Ruth Garcia, female    DOB: 14-Sep-1971, 45 y.o.   MRN: [id number]  HPI  45 year old female presents to talk about new diagnosis of breast cancer in her sister. She also has concerns that there is a vaginal mass and a recurrence of her urethral cyst. Patient's sister and 2 and were diagnosed with breast cancer. One of her aunts is BRCA 2 positive. Her mother is negative for my risk. Her sister is awaiting testing. She was just diagnosed 2 weeks ago. Breast cancer appears to be stage II. Patient would like increased surveillance. Maximal surveillance based on family history is alternating mammograms and breast MRIs with and without contrast every 6 months. This was ordered today. Patient has anxiety and needs open MRI. This was also requested. Patient will be given prescription for Xanax to take prior to the MRI. Patient has a history of a urethral cyst removed after pregnancy. It was done with a urologist and gynecologist. She says it was attached to the urethra. She thinks it might have been a diverticulum. She's worried there is recurrence. She can see a change in her vaginal mucosa. Patient denies leakage of urine pelvic pain vaginal bleeding or discharge.  Patient has not taken any medications or had procedure since the surgery to make the vaginal mass change.  Past Surgical History:  Procedure Laterality Date  . URETHRAL CYST REMOVAL      Review of Systems  Constitutional: Negative.   Respiratory: Negative.   Cardiovascular: Negative.   Gastrointestinal: Negative.   Endocrine: Negative.   Genitourinary:       Vaginal mass seen  Neurological: Negative.   Psychiatric/Behavioral: The patient is nervous/anxious.        Objective:   Physical Exam  Constitutional: She is oriented to person, place, and time. She appears well-developed and well-nourished.  HENT:  Head: Normocephalic and atraumatic.  Eyes: Pupils are equal, round, and reactive to light.    Cardiovascular: Normal rate.   Pulmonary/Chest:  Breast exam negative bilaterally for skin changes, nipple discharges, or mass.  Abdominal: Soft. She exhibits no distension and no mass. There is no tenderness. There is no rebound and no guarding.  Genitourinary:  Genitourinary Comments: Vulva:  Tanner V Vagina:  Remnants of hymenal ring are what patient is seeing.  Nml anatomy for s/p NSVD x2 Urethra:  Well supported, no mass Cervix:  Close nontender Uterus: Retroverted mobile nontender Adnexa: Nontender no mass   Musculoskeletal: She exhibits no edema.  Neurological: She is alert and oriented to person, place, and time.  Skin: Skin is warm and dry.  Psychiatric: She has a normal mood and affect.      Assessment & Plan:  45 year old female with strong family history of breast cancer and worried about vaginal mass  1-patient family history is strong enough that she could be's have surveillance with mammogram and breast MRI yearly. These should be spaced every 6 months. These were ordered today as well as Xanax for anxiety during an MRI.  Patient also thinking about getting my risk drawn. 2-normal vagina and urethra. Patient has normal hymenal remnants post delivery. There is no mass at this time. Patient reassured. 3-Pap smear due next spring.

## 2016-05-30 ENCOUNTER — Other Ambulatory Visit: Payer: BLUE CROSS/BLUE SHIELD

## 2016-05-30 DIAGNOSIS — Z803 Family history of malignant neoplasm of breast: Secondary | ICD-10-CM | POA: Diagnosis not present

## 2016-05-30 DIAGNOSIS — Z808 Family history of malignant neoplasm of other organs or systems: Secondary | ICD-10-CM | POA: Diagnosis not present

## 2016-05-30 DIAGNOSIS — Z8041 Family history of malignant neoplasm of ovary: Secondary | ICD-10-CM | POA: Diagnosis not present

## 2016-06-01 DIAGNOSIS — F4323 Adjustment disorder with mixed anxiety and depressed mood: Secondary | ICD-10-CM | POA: Diagnosis not present

## 2016-06-15 ENCOUNTER — Encounter: Payer: Self-pay | Admitting: *Deleted

## 2016-06-15 ENCOUNTER — Telehealth: Payer: Self-pay | Admitting: *Deleted

## 2016-06-15 DIAGNOSIS — F4323 Adjustment disorder with mixed anxiety and depressed mood: Secondary | ICD-10-CM | POA: Diagnosis not present

## 2016-06-15 NOTE — Telephone Encounter (Signed)
Pt notified of neg My Risk genetics.  Pt states that her sister is also neg.    Copy of the lab given to patient.

## 2016-06-26 ENCOUNTER — Encounter: Payer: Self-pay | Admitting: Family Medicine

## 2016-06-26 ENCOUNTER — Ambulatory Visit (INDEPENDENT_AMBULATORY_CARE_PROVIDER_SITE_OTHER): Payer: BLUE CROSS/BLUE SHIELD | Admitting: Family Medicine

## 2016-06-26 VITALS — BP 128/60 | HR 79 | Wt 137.0 lb

## 2016-06-26 DIAGNOSIS — N644 Mastodynia: Secondary | ICD-10-CM | POA: Diagnosis not present

## 2016-06-26 DIAGNOSIS — G4700 Insomnia, unspecified: Secondary | ICD-10-CM

## 2016-06-26 DIAGNOSIS — F419 Anxiety disorder, unspecified: Secondary | ICD-10-CM

## 2016-06-26 MED ORDER — NYSTATIN-TRIAMCINOLONE 100000-0.1 UNIT/GM-% EX OINT: 1.0000 "application " | TOPICAL_OINTMENT | Freq: Two times a day (BID) | CUTANEOUS | 0 refills | Status: DC

## 2016-06-26 NOTE — Progress Notes (Signed)
Subjective:    CC:  Left nipple red and sore.    HPI: 45 year old otherwise healthy female comes in today with left breast pain and redness. She noticed it 2 days ago on Saturday. She denies any trauma or injury.  Last mammo is 08/31/2015.  She actually had an ultrasound which was normal and they recommended repeat breast cancer screening in 1 year. Her sister was diagnosed with breast cancer in March that she has been particularly concerned. She is Artie had a bracket testing though which is negative.  Not sleeping well. She's not been sleeping well for a while. Her that is the excess worry. She does not drink caffeine and says she really tries to go to bed at a regular time and feels like the sleep environment is conducive to sleep and is "quiet and dark.  BP 128/60   Pulse 79   Wt 137 lb (62.1 kg)   SpO2 100%   BMI 22.11 kg/m     No Known Allergies  No past medical history on file.  Past Surgical History:  Procedure Laterality Date  . URETHRAL CYST REMOVAL      Social History   Social History  . Marital status: Married    Spouse name: N/A  . Number of children: N/A  . Years of education: N/A   Occupational History  . teacher     Social History Main Topics  . Smoking status: Never Smoker  . Smokeless tobacco: Never Used  . Alcohol use 0.6 oz/week    1 Glasses of wine per week     Comment: WEEKLY  . Drug use: No  . Sexual activity: Yes    Partners: Male    Birth control/ protection: Surgical     Comment: husband had vasectomy   Other Topics Concern  . Not on file   Social History Narrative  . No narrative on file    Family History  Problem Relation Age of Onset  . Thyroid disease Mother   . Thyroid disease Daughter   . Cancer Maternal Aunt     2 with breast and one with ovarian  . Cancer Maternal Uncle     colon  . Breast cancer Sister     Outpatient Encounter Prescriptions as of 06/26/2016  Medication Sig  . Calcium Carbonate-Vitamin D (CALCIUM +  D PO) Take 1 tablet by mouth daily.  . cholecalciferol (VITAMIN D) 1000 units tablet Take 1,000 Units by mouth daily.  . fish oil-omega-3 fatty acids 1000 MG capsule Take 2 g by mouth daily.  . Multiple Vitamin (MULTIVITAMIN) capsule Take 1 capsule by mouth daily.  . Selenium 200 MCG CAPS Take by mouth.  . vitamin C (ASCORBIC ACID) 500 MG tablet Take 500 mg by mouth daily.  . vitamin E 400 UNIT capsule Take 400 Units by mouth daily.  Marland Kitchen nystatin-triamcinolone ointment (MYCOLOG) Apply 1 application topically 2 (two) times daily.   No facility-administered encounter medications on file as of 06/26/2016.      .   Objective:    General: Well Developed, well nourished, and in no acute distress.  Neuro: Alert and oriented x3, extra-ocular muscles intact, sensation grossly intact.  HEENT: Normocephalic, atraumatic  Skin: Warm and dry, no rashes. Cardiac: Regular rate and rhythm, no murmurs rubs or gallops, no lower extremity edema.  Respiratory: Clear to auscultation bilaterally. Not using accessory muscles, speaking in full sentences.   Impression and Recommendations:    Left nipple pain-most consistent with chafing. We discussed  wearing a good supportive bra and throwing out old ones that may have some of the elastic warn out in them. Also recommend using nystatin triamcinolone cream. If not better in 10 days then please let me know. She'll follow back up to just reinspect the area.  Anxiety-she's had a lot of anxiety since her to sister was diagnosed with breast cancer. She feels like some of her anxiety has calmed down a little bit but after she saw the redness she became hyper anxious about it. Her regular mammogram is actually scheduled scheduled for November. We can always order diagnostic mammogram if her symptoms are not improving or if they are getting worse.  Insomnia-suspect this is mostly transient from increased stress levels. Encouraged her to work on lowering stress levels. We  can certainly refer her if needed. Could consider over-the-counter Benadryl if not improving as well.

## 2016-06-29 DIAGNOSIS — F4323 Adjustment disorder with mixed anxiety and depressed mood: Secondary | ICD-10-CM | POA: Diagnosis not present

## 2016-07-07 ENCOUNTER — Encounter: Payer: Self-pay | Admitting: Family Medicine

## 2016-07-07 ENCOUNTER — Ambulatory Visit (INDEPENDENT_AMBULATORY_CARE_PROVIDER_SITE_OTHER): Payer: BLUE CROSS/BLUE SHIELD | Admitting: Family Medicine

## 2016-07-07 VITALS — BP 133/63 | HR 64 | Wt 136.0 lb

## 2016-07-07 DIAGNOSIS — N644 Mastodynia: Secondary | ICD-10-CM

## 2016-07-07 DIAGNOSIS — G4700 Insomnia, unspecified: Secondary | ICD-10-CM | POA: Diagnosis not present

## 2016-07-07 NOTE — Progress Notes (Signed)
   Subjective:    Patient ID: Ruth Garcia, female    DOB: 1971/09/25, 45 y.o.   MRN: [id number]  HPI Insomnia - recent stress levels had been affecting her sleep. She feels that this has been better.    Nipple Pain - 45 year old female with no prior history of breast tissues came in a couple weeks ago for nipple pain. I was most suspicious of chafing and gave her a nystatin triamcinolone cream to use. Her sister was recently diagnosed with breast cancer so she was particularly concerned. Her mammogram is up-to-date. She is here for follow-up today. Has not felt any masses or nodules in her breast tissue. She has started wearing more supportive bra.  He also started using a chafing cream when she runs for exercise.   Review of Systems     Objective:   Physical Exam  Constitutional: She is oriented to person, place, and time. She appears well-developed and well-nourished.  HENT:  Head: Normocephalic and atraumatic.  Eyes: Conjunctivae and EOM are normal.  Cardiovascular: Normal rate.   Pulmonary/Chest: Effort normal. Right breast exhibits no inverted nipple, no mass, no nipple discharge, no skin change and no tenderness. Left breast exhibits no inverted nipple, no mass, no nipple discharge, no skin change and no tenderness. Breasts are symmetrical.  Neurological: She is alert and oriented to person, place, and time.  Skin: Skin is dry. No pallor.  Psychiatric: She has a normal mood and affect. Her behavior is normal.  Vitals reviewed.      Assessment & Plan:  Insomnia - improved. Continue to work on reducing stress levels.  Nipple Pain - improved. Continue the cream for a total of 14 days and then discontinue.  Keep appt in Novmeber for regular mammo. That she can always call sooner and we can schedule diagnostic if her in terms return or she notices a mass.

## 2016-07-12 ENCOUNTER — Ambulatory Visit (INDEPENDENT_AMBULATORY_CARE_PROVIDER_SITE_OTHER): Payer: BLUE CROSS/BLUE SHIELD | Admitting: Obstetrics & Gynecology

## 2016-07-12 ENCOUNTER — Encounter: Payer: Self-pay | Admitting: Obstetrics & Gynecology

## 2016-07-12 VITALS — BP 143/73 | HR 80 | Ht 66.0 in | Wt 135.0 lb

## 2016-07-12 DIAGNOSIS — N926 Irregular menstruation, unspecified: Secondary | ICD-10-CM | POA: Diagnosis not present

## 2016-07-12 NOTE — Progress Notes (Signed)
   Subjective:    Patient ID: Ruth Garcia, female    DOB: 05-08-1971, 45 y.o.   MRN: [id number]  HPI  45 yo MW P2 (98 yo and 61 yo kids) here today to discuss that her periods are becoming somewhat irregular. This started in May 2017. She has kept a calender. 02-29-16 and then 04-17-16 and 05-09-16 and 06-30-16. Prior to this they were almost always q24-28 days. Her husband has had a vasectomy.  She also has a "red nipple" for the last 2 weeks. Dr. Madilyn Fireman saw this and diagnosed an issue with friction (She is a runner) and gave her nystatin and steroid. Her 44 yo sister was just diagnosed with left breast cancer.  She has had some mood swings, no vaginal dryness or hot flashes.  Review of Systems FH strong for thyroid disease. Her mammogram is scheduled for 11/17.    Objective:   Physical Exam [id number] Breathing, conversing, and ambulating normally Entirely normal breast exam BL Her nipples are both redder in coloration than some but they are equal in color       Assessment & Plan:  Irregular periods- probable perimenopause but with strong FH of thyroid diseaese, rec check thyroid panel

## 2016-07-13 DIAGNOSIS — F4323 Adjustment disorder with mixed anxiety and depressed mood: Secondary | ICD-10-CM | POA: Diagnosis not present

## 2016-07-13 LAB — T3, FREE: T3 FREE: 3.1 pg/mL (ref 2.3–4.2)

## 2016-07-13 LAB — T4, FREE: FREE T4: 1.2 ng/dL (ref 0.8–1.8)

## 2016-07-27 DIAGNOSIS — F4323 Adjustment disorder with mixed anxiety and depressed mood: Secondary | ICD-10-CM | POA: Diagnosis not present

## 2016-08-07 DIAGNOSIS — F4323 Adjustment disorder with mixed anxiety and depressed mood: Secondary | ICD-10-CM | POA: Diagnosis not present

## 2016-08-16 DIAGNOSIS — L821 Other seborrheic keratosis: Secondary | ICD-10-CM | POA: Diagnosis not present

## 2016-08-16 DIAGNOSIS — D239 Other benign neoplasm of skin, unspecified: Secondary | ICD-10-CM | POA: Diagnosis not present

## 2016-08-24 DIAGNOSIS — F4323 Adjustment disorder with mixed anxiety and depressed mood: Secondary | ICD-10-CM | POA: Diagnosis not present

## 2016-08-29 ENCOUNTER — Encounter: Payer: Self-pay | Admitting: Family Medicine

## 2016-08-29 ENCOUNTER — Ambulatory Visit (INDEPENDENT_AMBULATORY_CARE_PROVIDER_SITE_OTHER): Payer: BLUE CROSS/BLUE SHIELD | Admitting: Family Medicine

## 2016-08-29 VITALS — BP 135/56 | HR 75 | Wt 134.0 lb

## 2016-08-29 DIAGNOSIS — F418 Other specified anxiety disorders: Secondary | ICD-10-CM | POA: Diagnosis not present

## 2016-08-29 MED ORDER — SERTRALINE HCL 50 MG PO TABS: ORAL_TABLET | ORAL | 1 refills | Status: DC

## 2016-08-29 NOTE — Progress Notes (Signed)
Subjective:    Patient ID: Ruth Garcia, female    DOB: 1971/08/03, 45 y.o.   MRN: [id number]  HPI 45 yo female Comes in today complaining of feeling down/sad.Ever since she found out that her sister Ruth Garcia has breast cancer she just feels like her emotions have really been revved up. She's really started to feel down probably last winter at the early part of the year but it has been getting progressively worse over the last couple months. In fact she actually reached out and started doing some counseling about 2 months ago. She says it has been really helpful but her counselor finally suggested that she consider coming in and discussing medication as a treatment option. She is open to that today. She has her mammogram scheduled on Friday and she is very nervous about this. She says her anxiety is getting to the point where her husband and her children are noticing that she's not herself.     Review of Systems  BP (!) 135/56   Pulse 75   Wt 134 lb (60.8 kg)   SpO2 95%   BMI 21.63 kg/m     No Known Allergies  No past medical history on file.  Past Surgical History:  Procedure Laterality Date  . URETHRAL CYST REMOVAL      Social History   Social History  . Marital status: Married    Spouse name: N/A  . Number of children: N/A  . Years of education: N/A   Occupational History  . teacher     Social History Main Topics  . Smoking status: Never Smoker  . Smokeless tobacco: Never Used  . Alcohol use 0.6 oz/week    1 Glasses of wine per week     Comment: WEEKLY  . Drug use: No  . Sexual activity: Yes    Partners: Male    Birth control/ protection: Surgical     Comment: husband had vasectomy   Other Topics Concern  . Not on file   Social History Narrative  . No narrative on file    Family History  Problem Relation Age of Onset  . Thyroid disease Mother   . Thyroid disease Daughter   . Cancer Maternal Aunt     2 with breast and one with ovarian  . Cancer  Maternal Uncle     colon  . Breast cancer Sister     Outpatient Encounter Prescriptions as of 08/29/2016  Medication Sig  . Calcium Carbonate-Vitamin D (CALCIUM + D PO) Take 1 tablet by mouth daily.  . Cholecalciferol 2000 units CAPS Take 1 capsule by mouth daily.  . fish oil-omega-3 fatty acids 1000 MG capsule Take 2 g by mouth daily.  . Multiple Vitamin (MULTIVITAMIN) capsule Take 1 capsule by mouth daily.  . Selenium 200 MCG CAPS Take by mouth.  . sertraline (ZOLOFT) 50 MG tablet 1/2 tab daily x 5 days then, 1 po QD.  Marland Kitchen vitamin C (ASCORBIC ACID) 500 MG tablet Take 500 mg by mouth daily.  . vitamin E 400 UNIT capsule Take 400 Units by mouth daily.  . [DISCONTINUED] cholecalciferol (VITAMIN D) 1000 units tablet Take 1,000 Units by mouth daily.  . [DISCONTINUED] nystatin-triamcinolone ointment (MYCOLOG) Apply 1 application topically 2 (two) times daily. (Patient not taking: Reported on 07/12/2016)   No facility-administered encounter medications on file as of 08/29/2016.          Objective:   Physical Exam  Constitutional: She is oriented to person, place, and  time. She appears well-developed and well-nourished.  HENT:  Head: Normocephalic and atraumatic.  Eyes: Conjunctivae are normal.  Cardiovascular: Normal rate.   Pulmonary/Chest: Effort normal and breath sounds normal.  Neurological: She is alert and oriented to person, place, and time.  Skin: Skin is warm and dry. No pallor.  Psychiatric: She has a normal mood and affect. Her behavior is normal.  Vitals reviewed.      Assessment & Plan:  Depression with anxiety-GAD 7 score of 19 and PHQ 9 score of 17 today. Discussed treatment options. Will start with Zoloft. Have her follow-up in 3 weeks. Will adjust his at that time. Warned about potential side effects. Encouraged her to continue with her counseling/therapy.

## 2016-09-01 ENCOUNTER — Ambulatory Visit
Admission: RE | Admit: 2016-09-01 | Discharge: 2016-09-01 | Disposition: A | Discharge status: Home / Self Care | Payer: BLUE CROSS/BLUE SHIELD | Source: Ambulatory Visit | Attending: Obstetrics & Gynecology | Admitting: Obstetrics & Gynecology

## 2016-09-01 DIAGNOSIS — Z1231 Encounter for screening mammogram for malignant neoplasm of breast: Secondary | ICD-10-CM | POA: Diagnosis not present

## 2016-09-04 ENCOUNTER — Ambulatory Visit: Payer: BLUE CROSS/BLUE SHIELD

## 2016-09-11 ENCOUNTER — Other Ambulatory Visit: Payer: Self-pay | Admitting: Obstetrics & Gynecology

## 2016-09-11 DIAGNOSIS — R928 Other abnormal and inconclusive findings on diagnostic imaging of breast: Secondary | ICD-10-CM

## 2016-09-13 ENCOUNTER — Ambulatory Visit
Admission: RE | Admit: 2016-09-13 | Discharge: 2016-09-13 | Disposition: A | Discharge status: Home / Self Care | Payer: BLUE CROSS/BLUE SHIELD | Source: Ambulatory Visit | Attending: Obstetrics & Gynecology | Admitting: Obstetrics & Gynecology

## 2016-09-13 DIAGNOSIS — R928 Other abnormal and inconclusive findings on diagnostic imaging of breast: Secondary | ICD-10-CM

## 2016-09-13 DIAGNOSIS — N6011 Diffuse cystic mastopathy of right breast: Secondary | ICD-10-CM | POA: Diagnosis not present

## 2016-09-13 DIAGNOSIS — N6002 Solitary cyst of left breast: Secondary | ICD-10-CM | POA: Diagnosis not present

## 2016-09-13 DIAGNOSIS — N6314 Unspecified lump in the right breast, lower inner quadrant: Secondary | ICD-10-CM | POA: Diagnosis not present

## 2016-09-14 DIAGNOSIS — F4323 Adjustment disorder with mixed anxiety and depressed mood: Secondary | ICD-10-CM | POA: Diagnosis not present

## 2016-09-18 ENCOUNTER — Ambulatory Visit (INDEPENDENT_AMBULATORY_CARE_PROVIDER_SITE_OTHER): Payer: BLUE CROSS/BLUE SHIELD | Admitting: Family Medicine

## 2016-09-18 ENCOUNTER — Encounter: Payer: Self-pay | Admitting: Family Medicine

## 2016-09-18 VITALS — BP 130/64 | HR 71 | Ht 66.0 in | Wt 130.0 lb

## 2016-09-18 DIAGNOSIS — F418 Other specified anxiety disorders: Secondary | ICD-10-CM

## 2016-09-18 MED ORDER — SERTRALINE HCL 100 MG PO TABS: 100.0000 mg | ORAL_TABLET | Freq: Every day | ORAL | 0 refills | Status: DC

## 2016-09-18 NOTE — Progress Notes (Signed)
   Subjective:    Patient ID: Ruth Garcia, female    DOB: 1971-02-26, 45 y.o.   MRN: [id number]  HPI  Depression with anxiety- she was here 3 week ago .  At that time her GAD 7 score of 19 and PHQ 9 score of 17.  She started Zoloft.  She felt a little off the first 2 days but otherwise has felt fine on the medication. Right before Thanksgiving she actually got a call that her mammogram was abnormal and so had to go in for further evaluation. They felt like it was mostly breast cyst in both breasts. Her OB/GYN is also still trying to order a breast MRI. She's actually going down later this week to visit her sister who was recently diagnosed with breast cancer. She has 1 more chemotherapy treatment and will undergo surgery in January. Sleep has been fair. Energy has been low. No thoughts of wanting to harm herself.   Review of Systems     Objective:   Physical Exam  Constitutional: She is oriented to person, place, and time. She appears well-developed and well-nourished.  HENT:  Head: Normocephalic and atraumatic.  Cardiovascular: Normal rate, regular rhythm and normal heart sounds.   Pulmonary/Chest: Effort normal and breath sounds normal.  Neurological: She is alert and oriented to person, place, and time.  Skin: Skin is warm and dry.  Psychiatric: She has a normal mood and affect. Her behavior is normal.       Assessment & Plan:   Depression with anxiety - PHQ 9 score of 14 today and got 7 score of 15. Only slight improvement. We had a discussion about continuing her current regimen or increasing her dose. We opted to increase her 100 mg and I'll see her back in about 5-6 weeks. Continue with counseling/therapy.

## 2016-09-19 ENCOUNTER — Ambulatory Visit: Payer: BLUE CROSS/BLUE SHIELD | Admitting: Family Medicine

## 2016-10-23 ENCOUNTER — Encounter: Payer: Self-pay | Admitting: Family Medicine

## 2016-10-23 ENCOUNTER — Ambulatory Visit (INDEPENDENT_AMBULATORY_CARE_PROVIDER_SITE_OTHER): Payer: BLUE CROSS/BLUE SHIELD | Admitting: Family Medicine

## 2016-10-23 VITALS — BP 119/53 | HR 62 | Ht 66.0 in | Wt 138.0 lb

## 2016-10-23 DIAGNOSIS — F418 Other specified anxiety disorders: Secondary | ICD-10-CM | POA: Diagnosis not present

## 2016-10-23 MED ORDER — SERTRALINE HCL 100 MG PO TABS: 100.0000 mg | ORAL_TABLET | Freq: Every day | ORAL | 0 refills | Status: DC

## 2016-10-23 NOTE — Progress Notes (Signed)
Subjective:    CC: Mood  HPI:  F/U depression with anxiety - Patient is here to follow-up on mood. Overall she is doing well. She still complains of feeling nervous and anxious several days a week and some difficulty controlling worry. She also complains of feeling down and depressed several days a week and some difficulty concentrating. Her sister is doing well as she was recently diagnosed with breast cancer and has surgery coming up. She has noticed a difference with the medication feels it has been helping some. He denies any sleep problems. She has had some nights where she's felt very hot which is not usual for her. No sweats though. Areas of stone regular.  Past medical history, Surgical history, Family history not pertinant except as noted below, Social history, Allergies, and medications have been entered into the medical record, reviewed, and corrections made.   Review of Systems: No fevers, chills, night sweats, weight loss, chest pain, or shortness of breath.   Objective:    General: Well Developed, well nourished, and in no acute distress.  Neuro: Alert and oriented x3, extra-ocular muscles intact, sensation grossly intact.  HEENT: Normocephalic, atraumatic  Skin: Warm and dry, no rashes. Cardiac: Regular rate and rhythm, no murmurs rubs or gallops, no lower extremity edema.  Respiratory: Clear to auscultation bilaterally. Not using accessory muscles, speaking in full sentences.   Impression and Recommendations:    Depression with anxiety - Previous GAD 7 score of 19 is down to 7. Previous PHQ 9 score of 17 is down to 5. Significant improvement in the 60% improvement on current medication 100 mg. Discussed options including staying current regimen for a little longer versus adjusting the dose. She wants to stay 100 mg sertraline. Follow-up in 3 months. Call if any problems or if she feels like it needs to be adjusted.

## 2016-12-27 ENCOUNTER — Ambulatory Visit (INDEPENDENT_AMBULATORY_CARE_PROVIDER_SITE_OTHER): Payer: BLUE CROSS/BLUE SHIELD | Admitting: Physician Assistant

## 2016-12-27 VITALS — BP 125/81 | HR 60 | Wt 146.0 lb

## 2016-12-27 DIAGNOSIS — S0181XA Laceration without foreign body of other part of head, initial encounter: Secondary | ICD-10-CM

## 2016-12-27 NOTE — Patient Instructions (Signed)
Keep area clean and dry Do not get it wet for the first 24 hours Return for suture removal in 5 days  Return sooner for redness, drainage, or worsening symptoms   Sutured Wound Care Sutures are stitches that can be used to close wounds. Taking care of your wound properly can help to prevent pain and infection. It can also help your wound to heal more quickly. How is this treated? Wound Care   Keep the wound clean and dry.  If you were given a bandage (dressing), you should change it at least once per day or as directed by your health care provider. You should also change it if it becomes wet or dirty.  Keep the wound completely dry for the first 24 hours or as directed by your health care provider. After that time, you may shower or bathe. However, make sure that the wound is not soaked in water until the sutures have been removed.  Clean the wound one time each day or as directed by your health care provider.  Wash the wound with soap and water.  Rinse the wound with water to remove all soap.  Pat the wound dry with a clean towel. Do not rub the wound.  Aftercleaning the wound, apply a thin layer of antibioticointment as directed by your health care provider. This will help to prevent infection and keep the dressing from sticking to the wound.  Have the sutures removed as directed by your health care provider. General Instructions   Take or apply medicines only as directed by your health care provider.  To help prevent scarring, make sure to cover your wound with sunscreen whenever you are outside after the sutures are removed and the wound is healed. Make sure to wear a sunscreen of at least 30 SPF.  If you were prescribed an antibiotic medicine or ointment, finish all of it even if you start to feel better.  Do not scratch or pick at the wound.  Keep all follow-up visits as directed by your health care provider. This is important.  Check your wound every day for signs of  infection. Watch for:  Redness, swelling, or pain.  Fluid, blood, or pus.  Raise (elevate) the injured area above the level of your heart while you are sitting or lying down, if possible.  Avoid stretching your wound.  Drink enough fluids to keep your urine clear or pale yellow. Contact a health care provider if:  You received a tetanus shot and you have swelling, severe pain, redness, or bleeding at the injection site.  You have a fever.  A wound that was closed breaks open.  You notice a bad smell coming from the wound.  You notice something coming out of the wound, such as wood or glass.  Your pain is not controlled with medicine.  You have increased redness, swelling, or pain at the site of your wound.  You have fluid, blood, or pus coming from your wound.  You notice a change in the color of your skin near your wound.  You need to change the dressing frequently due to fluid, blood, or pus draining from the wound.  You develop a new rash.  You develop numbness around the wound. Get help right away if:  You develop severe swelling around the injury site.  Your pain suddenly increases and is severe.  You develop painful lumps near the wound or on skin that is anywhere on your body.  You have a red streak  going away from your wound.  The wound is on your hand or foot and you cannot properly move a finger or toe.  The wound is on your hand or foot and you notice that your fingers or toes look pale or bluish. This information is not intended to replace advice given to you by your health care provider. Make sure you discuss any questions you have with your health care provider. Document Released: 11/09/2004 Document Revised: 03/09/2016 Document Reviewed: 05/14/2013 Elsevier Interactive Patient Education  2017 [employer].

## 2016-12-31 NOTE — Progress Notes (Addendum)
HPI:                                                                Ruth Garcia is a 46 y.o. female who presents to [facility]: [place] today for chin laceration  Patient reports a trip and fall while running today. She struck her chin on the pavement and sustained a laceration. She was able to cover the area with a towel and control bleeding. States she felt lightheaded so she drove herself to the fire department. Denies loss of consciousness or headache. Endorses chin and jaw pain.   No past medical history on file. Past Surgical History:  Procedure Laterality Date  . URETHRAL CYST REMOVAL     Social History  Substance Use Topics  . Smoking status: Never Smoker  . Smokeless tobacco: Never Used  . Alcohol use 0.6 oz/week    1 Glasses of wine per week     Comment: WEEKLY   family history includes Breast cancer in her sister; Cancer in her maternal aunt and maternal uncle; Thyroid disease in her daughter and mother.  ROS: negative except as noted in the HPI  Medications: Current Outpatient Prescriptions  Medication Sig Dispense Refill  . Calcium Carbonate-Vitamin D (CALCIUM + D PO) Take 1 tablet by mouth daily.    . Cholecalciferol 2000 units CAPS Take 1 capsule by mouth daily.    . fish oil-omega-3 fatty acids 1000 MG capsule Take 2 g by mouth daily.    . Multiple Vitamin (MULTIVITAMIN) capsule Take 1 capsule by mouth daily.    . Selenium 200 MCG CAPS Take by mouth.    . sertraline (ZOLOFT) 100 MG tablet Take 1 tablet (100 mg total) by mouth daily. Pt will call when due for refill in March. 90 tablet 0  . vitamin C (ASCORBIC ACID) 500 MG tablet Take 500 mg by mouth daily.    . vitamin E 400 UNIT capsule Take 400 Units by mouth daily.     No current facility-administered medications for this visit.    No Known Allergies     Objective:  BP 125/81   Pulse 60   Wt 146 lb (66.2 kg)   BMI 23.57 kg/m  Gen: well-groomed,  cooperative, not ill-appearing, no distress Mouth: teeth intact, tongue atraumatic, oropharynx clear Pulm: Normal work of breathing, normal phonation Neuro: alert and oriented x 3, EOM's intact Skin: warm and dry, approx. 2 cm crescent shaped laceration on the mental protuberance   Assessment and Plan: 46 y.o. female with  1. Laceration of chin without complication, initial encounter Laceration repair: Indication: bleeding Location: mental protuberance (chin) Size: 2 cm Surface cleaned with alcohol Anesthesia: 3 cc, 0.5% Bupivicaine, good effect Wound explored, irrigated with 500 cc of normal saline Area prepped and draped in a sterile fashion. Type of suture material: 4-0 vicryl Number of sutures: 3, simple interrupted Hemostasis achieved. Patient tolerated procedure without immediate complications. Routine postprocedure instructions d/w pt- keep area clean and bandaged, follow up if concerns/spreading erythema/pain.   Follow up for suture removal in 5 days or sooner as needed  Darlyne Russian PA-C

## 2017-01-01 ENCOUNTER — Encounter: Payer: Self-pay | Admitting: Family Medicine

## 2017-01-01 ENCOUNTER — Ambulatory Visit (INDEPENDENT_AMBULATORY_CARE_PROVIDER_SITE_OTHER): Payer: BLUE CROSS/BLUE SHIELD | Admitting: Family Medicine

## 2017-01-01 VITALS — BP 116/52 | HR 79 | Ht 66.0 in | Wt 143.0 lb

## 2017-01-01 DIAGNOSIS — S0181XA Laceration without foreign body of other part of head, initial encounter: Secondary | ICD-10-CM

## 2017-01-01 NOTE — Progress Notes (Signed)
   Subjective:    Patient ID: Ruth Garcia, female    DOB: 10-17-1970, 46 y.o.   MRN: [id number]  HPI 46 year old female seen last week. She was running for exercise and actually tripped and fell and hit her chin. She had sutures replaced and she is here today to have those removed. She's been trying to ice it and she's been putting Neosporin cream on it as it's been a little bit itchy. She's otherwise doing well and says the swelling has gone down significantly. No active drainage from the wound.   Review of Systems     Objective:   Physical Exam  Constitutional: She is oriented to person, place, and time. She appears well-developed and well-nourished.  HENT:  Head: Normocephalic and atraumatic.  Right Ear: External ear normal.  Left Ear: External ear normal.  Neurological: She is alert and oriented to person, place, and time.  Skin: Skin is warm and dry.  Laceration is healing well. Still some scab in place with a little blood after the suture removal.    Psychiatric: She has a normal mood and affect. Her behavior is normal.        Assessment & Plan:  Suture removal status post fall. She is doing well without any complications. Right now does have a scab on it. Encouraged her not to remove the scab but just pat dry after her shower. Okay to apply the Neosporin for 1 or 2 more days and switch to Vaseline. After week once the scab is completely removed and there is no open wound or active drainage then she can switch to a scar cream such as Mederama.

## 2017-01-22 ENCOUNTER — Ambulatory Visit (INDEPENDENT_AMBULATORY_CARE_PROVIDER_SITE_OTHER): Payer: BLUE CROSS/BLUE SHIELD | Admitting: Family Medicine

## 2017-01-22 VITALS — BP 113/59 | HR 64 | Wt 148.0 lb

## 2017-01-22 DIAGNOSIS — F418 Other specified anxiety disorders: Secondary | ICD-10-CM | POA: Diagnosis not present

## 2017-01-22 MED ORDER — SERTRALINE HCL 100 MG PO TABS: 100.0000 mg | ORAL_TABLET | Freq: Every day | ORAL | 1 refills | Status: DC

## 2017-01-22 NOTE — Progress Notes (Signed)
Subjective:    CC: Mood  HPI:  Follow-up depression/anxiety-currently on sertraline 100 mg daily. This is her three-month follow-up today. She had noticed significant improvement on the medication when I last saw her in January. Last GAD 7 score was 7 and PHQ 9 was down to 5. She has noticed that she is sweating a little bit more easily especially at night and wondered if this could be a side effect of the medication but otherwise she sleeping well. No other side effects. this has really reduced her anxiety esp with dealing with her sisters recent dx of BrCa.    Past medical history, Surgical history, Family history not pertinant except as noted below, Social history, Allergies, and medications have been entered into the medical record, reviewed, and corrections made.   Review of Systems: No fevers, chills, night sweats, weight loss, chest pain, or shortness of breath.   Objective:    General: Well Developed, well nourished, and in no acute distress.  Neuro: Alert and oriented x3, extra-ocular muscles intact, sensation grossly intact.  HEENT: Normocephalic, atraumatic  Skin: Warm and dry, no rashes. Cardiac: Regular rate and rhythm, no murmurs rubs or gallops, no lower extremity edema.  Respiratory: Clear to auscultation bilaterally. Not using accessory muscles, speaking in full sentences.   Impression and Recommendations:    Depression/anxiety-symptoms continue to improve. Her PHQ 9 score is down to 1 and her dad 7 score is down to 5. She is doing really well. Continue current regimen for 6 months. Did explain that the medication could be causing some excessive sweating so certainly that could be contributing. She has her follow-up next month with Dr. Hulan Fray and will be getting scheduled for breast MRI seen.  Time spent 20 min, > 50% spent counseling about depression/anxiety

## 2017-03-21 ENCOUNTER — Ambulatory Visit (INDEPENDENT_AMBULATORY_CARE_PROVIDER_SITE_OTHER): Payer: BLUE CROSS/BLUE SHIELD | Admitting: Obstetrics & Gynecology

## 2017-03-21 ENCOUNTER — Encounter: Payer: Self-pay | Admitting: Obstetrics & Gynecology

## 2017-03-21 VITALS — BP 105/68 | HR 87 | Ht 66.0 in | Wt 146.0 lb

## 2017-03-21 DIAGNOSIS — Z01419 Encounter for gynecological examination (general) (routine) without abnormal findings: Secondary | ICD-10-CM | POA: Diagnosis not present

## 2017-03-21 DIAGNOSIS — N951 Menopausal and female climacteric states: Secondary | ICD-10-CM

## 2017-03-21 MED ORDER — ALPRAZOLAM 0.5 MG PO TABS: ORAL_TABLET | ORAL | 0 refills | Status: DC

## 2017-03-21 NOTE — Progress Notes (Signed)
Subjective:     Ruth Garcia is a 46 y.o. female here for a routine exam.  Current complaints: some night sweats and skipping some periods.  Pt due for MRI breast given extensive family history.     Gynecologic History Patient's last menstrual period was 02/25/2017. Contraception: none Last Pap: 2015. Results were: normal Last mammogram: 2017. Results were: required diagnostic--birads 2  Obstetric History OB History  Gravida Para Term Preterm AB Living  _0 SAB TAB Ectopic Multiple Live Births               # Outcome Date GA Lbr Len/2nd Weight Sex Delivery Anes PTL Lv  2 Term           1 Term                The following portions of the patient's history were reviewed and updated as appropriate: allergies, current medications, past family history, past medical history, past social history, past surgical history and problem list.  Review of Systems Pertinent items noted in HPI and remainder of comprehensive ROS otherwise negative.    Objective:      Vitals:   03/21/17 0934  BP: 105/68  Pulse: 87  Weight: 146 lb (66.2 kg)  Height: _1  (1.676 m)   Vitals:  WNL General appearance: alert, cooperative and no distress  HEENT: Normocephalic, without obvious abnormality, atraumatic Eyes: negative Throat: lips, mucosa, and tongue normal; teeth and gums normal  Respiratory: Clear to auscultation bilaterally  CV: Regular rate and rhythm  Breasts:  Normal appearance, no masses or tenderness, no nipple retraction or dimpling  GI: Soft, non-tender; bowel sounds normal; no masses,  no organomegaly  GU: External Genitalia:  Tanner V, no lesion Urethra:  No prolapse   Vagina: Pink, normal rugae, no blood or discharge  Cervix: No CMT, no lesion  Uterus:  Normal size and contour, non tender, retroverted  Adnexa: Normal, no masses, non tender  Musculoskeletal: No edema, redness or tenderness in the calves or thighs  Skin: No lesions or rash  Lymphatic: Axillary  adenopathy: none     Psychiatric: Normal mood and behavior   Vitals:   03/21/17 0934  BP: 105/68  Pulse: 87  Weight: 146 lb (66.2 kg)  Height: _2  (1.676 m)        Assessment:    Healthy female exam.    Plan:    Per radiologist recommendations--alternate mammogram and mri q6 months Pap with co testing Referral to GI for new cancer recommendations BrCA neg [facility]

## 2017-03-22 ENCOUNTER — Telehealth: Payer: Self-pay | Admitting: *Deleted

## 2017-03-22 LAB — FOLLICLE STIMULATING HORMONE: FSH: 6.7 m[IU]/mL

## 2017-03-22 NOTE — Telephone Encounter (Signed)
Pt notified of normal FSH and info given to schedule colonoscopy at [facility].

## 2017-03-22 NOTE — Telephone Encounter (Signed)
-----   Message from Guss Bunde, MD sent at 03/22/2017  6:27 AM EDT ----- Not in menopause yet.  Let us know if menstrual cycles become eratic

## 2017-03-23 LAB — CYTOLOGY - PAP
Diagnosis: NEGATIVE
HPV: NOT DETECTED

## 2017-03-30 ENCOUNTER — Other Ambulatory Visit: Payer: BLUE CROSS/BLUE SHIELD

## 2017-04-03 ENCOUNTER — Other Ambulatory Visit: Payer: BLUE CROSS/BLUE SHIELD

## 2017-04-05 ENCOUNTER — Other Ambulatory Visit: Payer: BLUE CROSS/BLUE SHIELD

## 2017-04-06 ENCOUNTER — Ambulatory Visit
Admission: RE | Admit: 2017-04-06 | Discharge: 2017-04-06 | Disposition: A | Discharge status: Home / Self Care | Payer: BLUE CROSS/BLUE SHIELD | Source: Ambulatory Visit | Attending: Obstetrics & Gynecology | Admitting: Obstetrics & Gynecology

## 2017-04-06 DIAGNOSIS — Z803 Family history of malignant neoplasm of breast: Secondary | ICD-10-CM | POA: Diagnosis not present

## 2017-04-06 MED ORDER — GADOBENATE DIMEGLUMINE 529 MG/ML IV SOLN
13.0000 mL | Freq: Once | INTRAVENOUS | Status: AC | PRN
  Administered 2017-04-06: 13 mL via INTRAVENOUS

## 2017-04-10 ENCOUNTER — Telehealth: Payer: Self-pay | Admitting: *Deleted

## 2017-04-10 ENCOUNTER — Other Ambulatory Visit: Payer: Self-pay | Admitting: Obstetrics & Gynecology

## 2017-04-10 DIAGNOSIS — N63 Unspecified lump in unspecified breast: Secondary | ICD-10-CM

## 2017-04-10 NOTE — Telephone Encounter (Signed)
Pt notified of MRI results and she is scheduled for Bilateral diagnostic and U/S of breast .  The appt is 04/25/17 @ 3:00 check in @ 2:40.  Pt is aware of appt.

## 2017-04-11 ENCOUNTER — Encounter: Payer: Self-pay | Admitting: Obstetrics & Gynecology

## 2017-04-11 DIAGNOSIS — R928 Other abnormal and inconclusive findings on diagnostic imaging of breast: Secondary | ICD-10-CM | POA: Insufficient documentation

## 2017-04-15 HISTORY — PX: BREAST BIOPSY: SHX20

## 2017-04-25 ENCOUNTER — Ambulatory Visit
Admission: RE | Admit: 2017-04-25 | Discharge: 2017-04-25 | Disposition: A | Discharge status: Home / Self Care | Payer: BLUE CROSS/BLUE SHIELD | Source: Ambulatory Visit | Attending: Obstetrics & Gynecology | Admitting: Obstetrics & Gynecology

## 2017-04-25 DIAGNOSIS — N63 Unspecified lump in unspecified breast: Secondary | ICD-10-CM | POA: Diagnosis not present

## 2017-04-25 DIAGNOSIS — N6322 Unspecified lump in the left breast, upper inner quadrant: Secondary | ICD-10-CM | POA: Diagnosis not present

## 2017-04-26 ENCOUNTER — Telehealth: Payer: Self-pay | Admitting: *Deleted

## 2017-04-26 DIAGNOSIS — F419 Anxiety disorder, unspecified: Secondary | ICD-10-CM

## 2017-04-26 MED ORDER — ALPRAZOLAM 0.5 MG PO TABS: ORAL_TABLET | ORAL | 0 refills | Status: DC

## 2017-04-26 NOTE — Telephone Encounter (Signed)
-----   Message from Guss Bunde, MD sent at 04/26/2017 10:12 AM EDT ----- I can't tell from the radiologist notes if she is getting a biopsy.  Can you call her?

## 2017-04-26 NOTE — Telephone Encounter (Signed)
Spoke with patient and she is going to go ahead and have the biopsyof her breast.  The radiologist has discussed this with her and she is awaiting a call from Rad to schedule the procedure.

## 2017-05-02 ENCOUNTER — Other Ambulatory Visit: Payer: Self-pay | Admitting: Obstetrics & Gynecology

## 2017-05-02 DIAGNOSIS — N63 Unspecified lump in unspecified breast: Secondary | ICD-10-CM

## 2017-05-07 ENCOUNTER — Ambulatory Visit
Admission: RE | Admit: 2017-05-07 | Discharge: 2017-05-07 | Disposition: A | Discharge status: Home / Self Care | Payer: BLUE CROSS/BLUE SHIELD | Source: Ambulatory Visit | Attending: Obstetrics & Gynecology | Admitting: Obstetrics & Gynecology

## 2017-05-07 ENCOUNTER — Other Ambulatory Visit: Payer: BLUE CROSS/BLUE SHIELD

## 2017-05-07 DIAGNOSIS — N63 Unspecified lump in unspecified breast: Secondary | ICD-10-CM

## 2017-05-07 DIAGNOSIS — N6489 Other specified disorders of breast: Secondary | ICD-10-CM | POA: Diagnosis not present

## 2017-05-07 MED ORDER — GADOBENATE DIMEGLUMINE 529 MG/ML IV SOLN
13.0000 mL | Freq: Once | INTRAVENOUS | Status: AC | PRN
  Administered 2017-05-07: 13 mL via INTRAVENOUS

## 2017-05-10 ENCOUNTER — Ambulatory Visit (INDEPENDENT_AMBULATORY_CARE_PROVIDER_SITE_OTHER): Payer: BLUE CROSS/BLUE SHIELD | Admitting: Family Medicine

## 2017-05-10 ENCOUNTER — Encounter: Payer: Self-pay | Admitting: Family Medicine

## 2017-05-10 VITALS — BP 111/51 | HR 57 | Ht 66.0 in | Wt 147.0 lb

## 2017-05-10 DIAGNOSIS — Z0000 Encounter for general adult medical examination without abnormal findings: Secondary | ICD-10-CM | POA: Diagnosis not present

## 2017-05-10 DIAGNOSIS — B079 Viral wart, unspecified: Secondary | ICD-10-CM | POA: Diagnosis not present

## 2017-05-10 DIAGNOSIS — F418 Other specified anxiety disorders: Secondary | ICD-10-CM | POA: Diagnosis not present

## 2017-05-10 NOTE — Patient Instructions (Signed)
Keep up a regular exercise program and make sure you are eating a healthy diet Try to eat 4 servings of dairy a day, or if you are lactose intolerant take a calcium with vitamin D daily.  Your vaccines are up to date.   

## 2017-05-10 NOTE — Progress Notes (Signed)
Subjective:     Ruth Garcia is a 46 y.o. female and is here for a comprehensive physical exam. The patient reports no problems.She actually had her breast MRI a couple of weeks ago. They did on a couple suspicious areas that she had a breast biopsy done on Monday. She got the results yesterday and they are negative. She does have a sister with breast cancer. She is doing well with her depression and wants to continue her Zoloft. She says it has helped her through this difficult time with her sister and one her own MRI came back abnormal.  She does have a lesion on her first finger on her right hand that she would like me to look at today. She thinks that the wart.  Social History   Social History  . Marital status: Married    Spouse name: N/A  . Number of children: N/A  . Years of education: N/A   Occupational History  . teacher     Social History Main Topics  . Smoking status: Never Smoker  . Smokeless tobacco: Never Used  . Alcohol use 0.6 oz/week    1 Glasses of wine per week     Comment: WEEKLY  . Drug use: No  . Sexual activity: Yes    Partners: Male    Birth control/ protection: Surgical     Comment: husband had vasectomy   Other Topics Concern  . Not on file   Social History Narrative  . No narrative on file   Health Maintenance  Topic Date Due  . INFLUENZA VACCINE  01/13/2018 (Originally 05/16/2017)  . MAMMOGRAM  04/25/2018  . PAP SMEAR  03/21/2022  . TETANUS/TDAP  12/18/2023  . HIV Screening  Completed    The following portions of the patient's history were reviewed and updated as appropriate: allergies, current medications, past family history, past medical history, past social history, past surgical history and problem list.  Review of Systems A comprehensive review of systems was negative.   Objective:    BP (!) 111/51   Pulse (!) 57   Ht 5\' 6"  (1.676 m)   Wt 147 lb (66.7 kg)   LMP 04/17/2017   BMI 23.73 kg/m  General appearance: alert,  cooperative and appears stated age Head: Normocephalic, without obvious abnormality, atraumatic Eyes: conj clear, EOMI, PEERLA Ears: normal TM's and external ear canals both ears Nose: Nares normal. Septum midline. Mucosa normal. No drainage or sinus tenderness. Throat: lips, mucosa, and tongue normal; teeth and gums normal Neck: no adenopathy, no carotid bruit, no JVD, supple, symmetrical, trachea midline and thyroid not enlarged, symmetric, no tenderness/mass/nodules Back: symmetric, no curvature. ROM normal. No CVA tenderness. Lungs: clear to auscultation bilaterally Breasts: normal appearance, no masses or tenderness, has biopsy incision sites on upper outer right breast that look clearn dry and intact Heart: regular rate and rhythm, S1, S2 normal, no murmur, click, rub or gallop Abdomen: soft, non-tender; bowel sounds normal; no masses,  no organomegaly Extremities: extremities normal, atraumatic, no cyanosis or edema Pulses: 2+ and symmetric Skin: Skin color, texture, turgor normal. No rashes or lesions Lymph nodes: Supraclavicular adenopathy: nl Neurologic: Alert and oriented X 3, normal strength and tone. Normal symmetric reflexes. Normal coordination and gait    Assessment:    Healthy female exam.      Plan:     See After Visit Summary for Counseling Recommendations   Keep up a regular exercise program and make sure you are eating a healthy diet  Try to eat 4 servings of dairy a day, or if you are lactose intolerant take a calcium with vitamin D daily.  Your vaccines are up to date.    Depression w/ anxiety -  Stable. She was to continue with her Zoloft at least through January when she gets her follow-up MRI in 6 months of the breasts. I will see her back in January and at that time we can decide if we want to continue the medication or not.  Wart-confirm diagnosis-recommend over-the-counter treatment. We are happy to do cryotherapy here if it's not responding.

## 2017-05-23 DIAGNOSIS — Z0000 Encounter for general adult medical examination without abnormal findings: Secondary | ICD-10-CM | POA: Diagnosis not present

## 2017-05-24 LAB — COMPLETE METABOLIC PANEL WITH GFR
ALBUMIN: 4.1 g/dL (ref 3.6–5.1)
ALK PHOS: 42 U/L (ref 33–115)
ALT: 20 U/L (ref 6–29)
AST: 20 U/L (ref 10–35)
BILIRUBIN TOTAL: 0.8 mg/dL (ref 0.2–1.2)
BUN: 11 mg/dL (ref 7–25)
CO2: 23 mmol/L (ref 20–32)
Calcium: 9.1 mg/dL (ref 8.6–10.2)
Chloride: 103 mmol/L (ref 98–110)
Creat: 0.73 mg/dL (ref 0.50–1.10)
Glucose, Bld: 77 mg/dL (ref 65–99)
Potassium: 4.3 mmol/L (ref 3.5–5.3)
Sodium: 137 mmol/L (ref 135–146)
TOTAL PROTEIN: 6.5 g/dL (ref 6.1–8.1)

## 2017-05-24 LAB — TSH: TSH: 2.73 mIU/L

## 2017-07-24 ENCOUNTER — Ambulatory Visit: Payer: BLUE CROSS/BLUE SHIELD | Admitting: Family Medicine

## 2017-09-10 ENCOUNTER — Other Ambulatory Visit: Payer: Self-pay | Admitting: Family Medicine

## 2017-09-18 DIAGNOSIS — D229 Melanocytic nevi, unspecified: Secondary | ICD-10-CM | POA: Diagnosis not present

## 2017-09-18 DIAGNOSIS — B079 Viral wart, unspecified: Secondary | ICD-10-CM | POA: Diagnosis not present

## 2017-11-12 ENCOUNTER — Ambulatory Visit: Payer: BLUE CROSS/BLUE SHIELD | Admitting: Family Medicine

## 2017-11-20 ENCOUNTER — Ambulatory Visit: Payer: BLUE CROSS/BLUE SHIELD | Admitting: Family Medicine

## 2017-11-20 ENCOUNTER — Encounter: Payer: Self-pay | Admitting: Family Medicine

## 2017-11-20 VITALS — BP 116/55 | HR 74 | Wt 148.0 lb

## 2017-11-20 DIAGNOSIS — N6459 Other signs and symptoms in breast: Secondary | ICD-10-CM | POA: Diagnosis not present

## 2017-11-20 DIAGNOSIS — F418 Other specified anxiety disorders: Secondary | ICD-10-CM

## 2017-11-20 MED ORDER — ALPRAZOLAM 0.5 MG PO TABS: ORAL_TABLET | ORAL | 0 refills | Status: DC

## 2017-11-20 NOTE — Progress Notes (Signed)
   Subjective:    Patient ID: Ruth Garcia, female    DOB: 09-Jun-1971, 47 y.o.   MRN: [id number]  HPI F/U depression and anxiety - 6 month f/u.   Overall she is doing really well.  Her sister finished her chemotherapy treatments for breast cancer back in October and is actually been doing well.  Right now she has a son he is getting ready to graduate from high school and will start college in the fall.  Her daughter who is 46 recently Lauren had a drive.  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  She feels like right now she just has a lot of stressors on her want to change or alter her medication.  She would like to leave it as it is.  She also wanted to double check if she was due for a repeat bilateral MRI of her breasts.  She had it done about 6 months ago and thought that she was supposed to be on a 38-month recall but has not received a call or letter from the breast clinic in [place].  Review of Systems     Objective:   Physical Exam  Constitutional: She is oriented to person, place, and time. She appears well-developed and well-nourished.  HENT:  Head: Normocephalic and atraumatic.  Cardiovascular: Normal rate, regular rhythm and normal heart sounds.  Pulmonary/Chest: Effort normal and breath sounds normal.  Neurological: She is alert and oriented to person, place, and time.  Skin: Skin is warm and dry.  Psychiatric: She has a normal mood and affect. Her behavior is normal.          Assessment & Plan:  Depression and Anxiety  -well-controlled overall.  PHQ 9 score of 4 today.  Gad 7 score of 4 as well.  We discussed options.  For now we will continue with current regimen.  I will see her back in 6 months.  Certainly in the interim if she decides that she wants to come down her dose or even discontinue the medication if she is doing really well and please feel free to reach out to me.  If at any point she feels like her symptoms are  increasing then please let us know  F/U abnormal Breast MRI -went ahead and place order for breast MRI to move forward per the report it does look like she is due so hopefully we can get the ball rolling on this for her.

## 2017-11-21 ENCOUNTER — Telehealth: Payer: Self-pay | Admitting: Family Medicine

## 2017-11-21 DIAGNOSIS — N6459 Other signs and symptoms in breast: Secondary | ICD-10-CM

## 2017-11-21 DIAGNOSIS — R933 Abnormal findings on diagnostic imaging of other parts of digestive tract: Secondary | ICD-10-CM

## 2017-11-21 NOTE — Telephone Encounter (Signed)
Ruth Garcia I forwarded you vm from [facility] so you could here it but they called about this patient who has been scheduled for Mri of breast and order needs to be changed to say with or with out contrast? Thanks

## 2017-11-21 NOTE — Telephone Encounter (Signed)
MRI with and without ordered.

## 2017-12-16 ENCOUNTER — Ambulatory Visit
Admission: RE | Admit: 2017-12-16 | Discharge: 2017-12-16 | Disposition: A | Discharge status: Home / Self Care | Payer: BLUE CROSS/BLUE SHIELD | Source: Ambulatory Visit | Attending: Family Medicine | Admitting: Family Medicine

## 2017-12-16 DIAGNOSIS — R933 Abnormal findings on diagnostic imaging of other parts of digestive tract: Secondary | ICD-10-CM

## 2017-12-16 DIAGNOSIS — N6459 Other signs and symptoms in breast: Secondary | ICD-10-CM

## 2017-12-16 DIAGNOSIS — Z853 Personal history of malignant neoplasm of breast: Secondary | ICD-10-CM | POA: Diagnosis not present

## 2017-12-16 MED ORDER — GADOBENATE DIMEGLUMINE 529 MG/ML IV SOLN
13.0000 mL | Freq: Once | INTRAVENOUS | Status: AC | PRN
  Administered 2017-12-16: 13 mL via INTRAVENOUS

## 2018-02-27 IMAGING — MG 2D DIGITAL SCREENING BILATERAL MAMMOGRAM WITH CAD AND ADJUNCT TO
9 of 12 series · 9 of 28 positions shown · non-contrast
Comparison: Previous exam(s).

CLINICAL DATA: Screening.

EXAM:
2D DIGITAL SCREENING BILATERAL MAMMOGRAM WITH CAD AND ADJUNCT TOMO

[L MLO synth-2D]
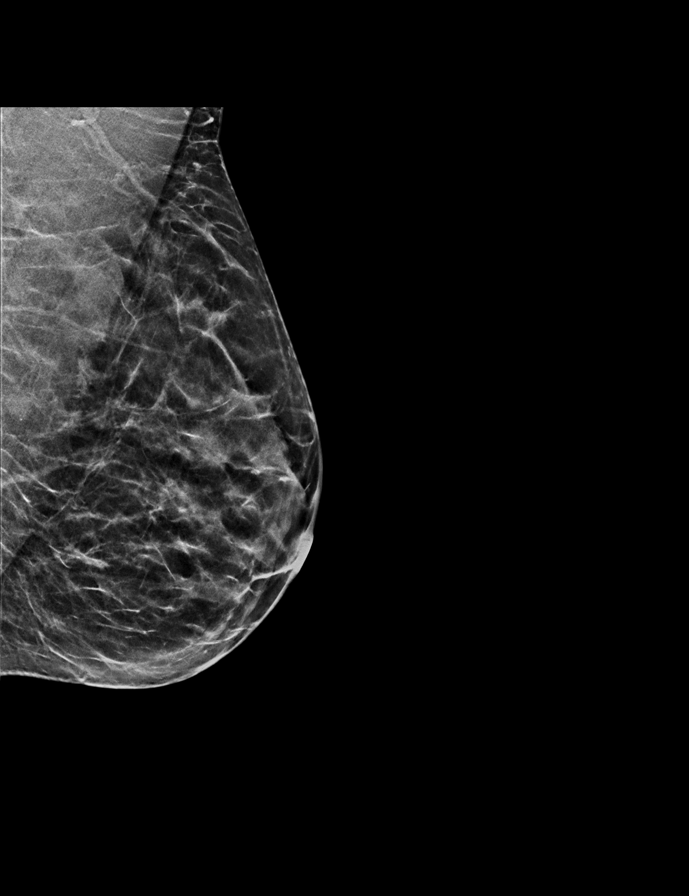

[L CC synth-2D]
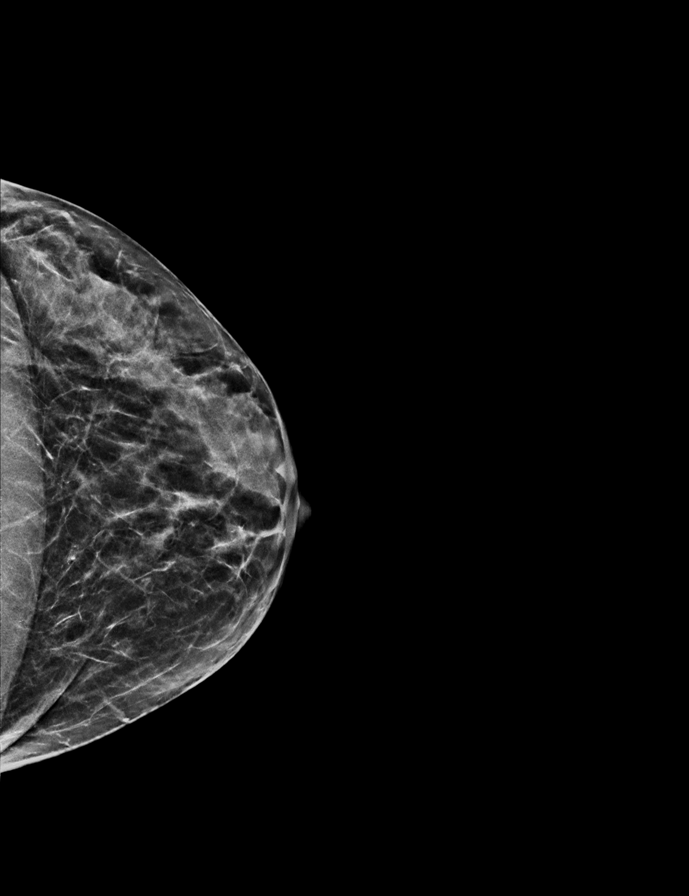

[R CC]
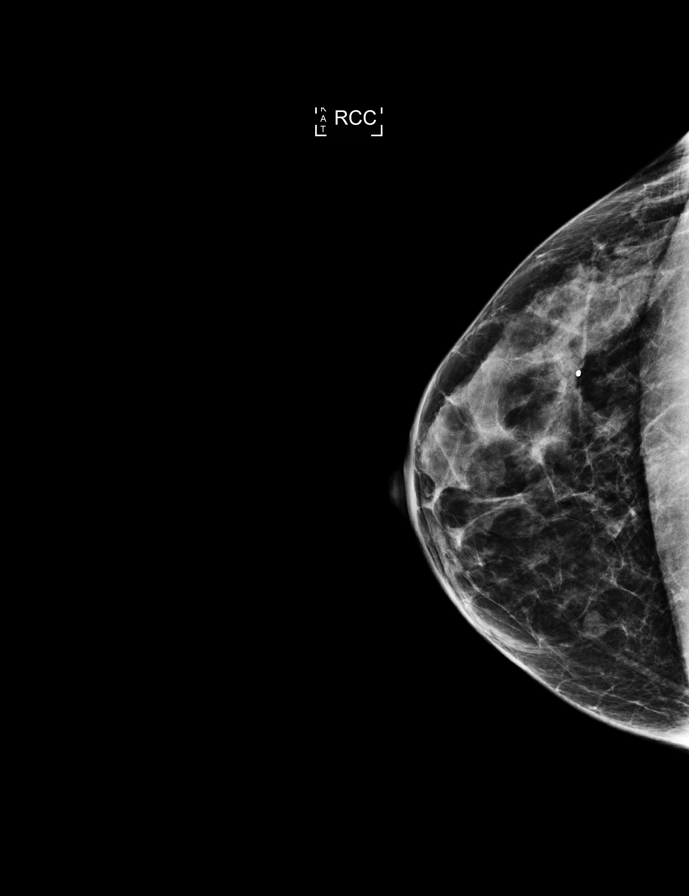

[L MLO]
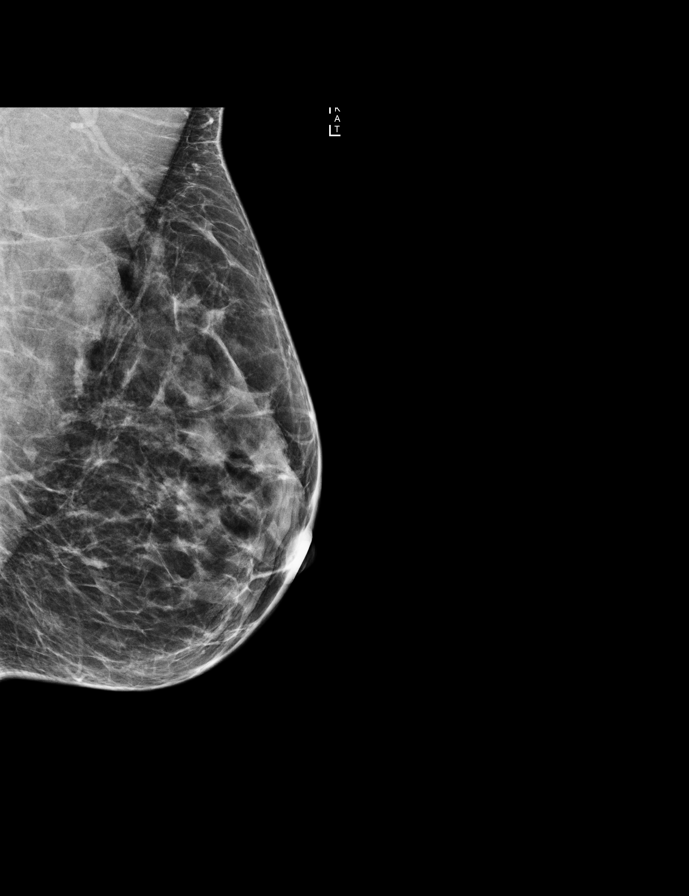

[R MLO synth-2D]
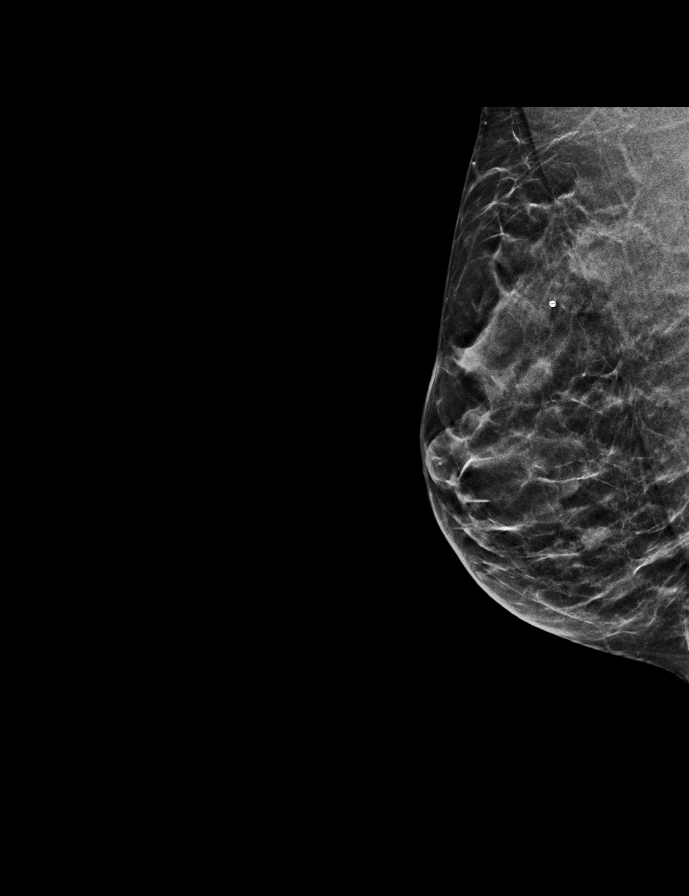

[R MLO]
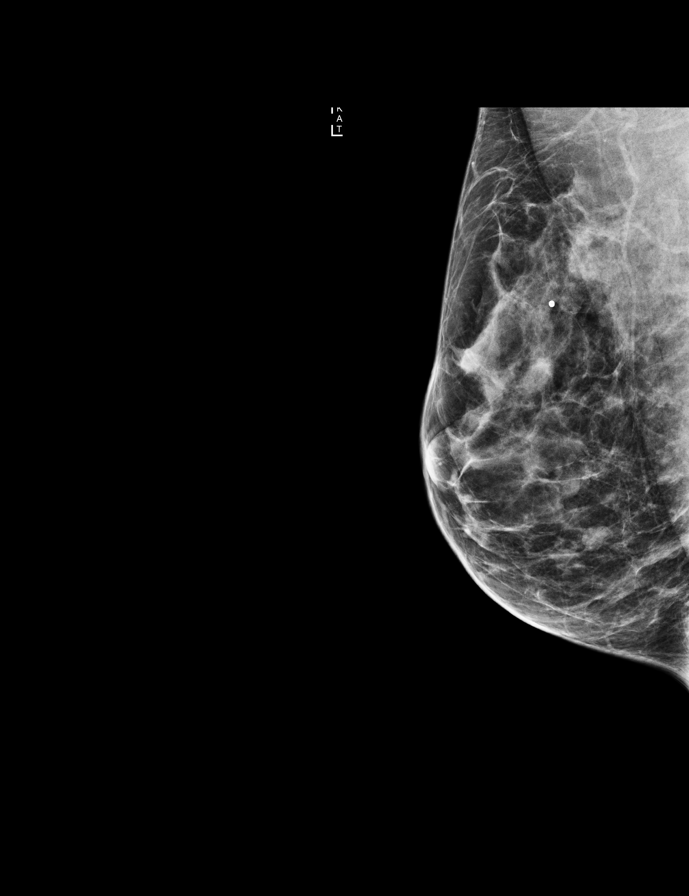

[L CC]
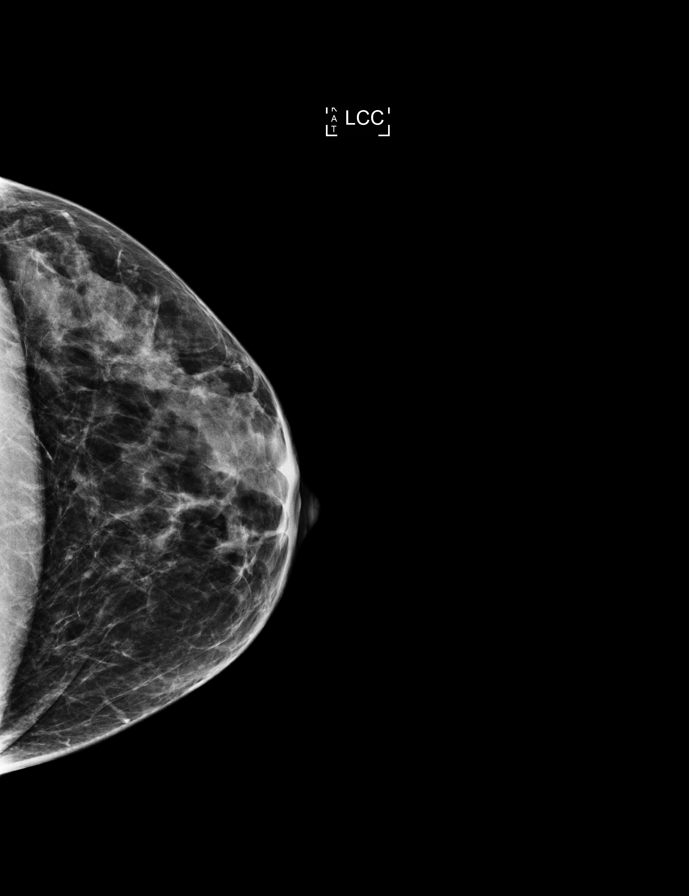

[R CC synth-2D]
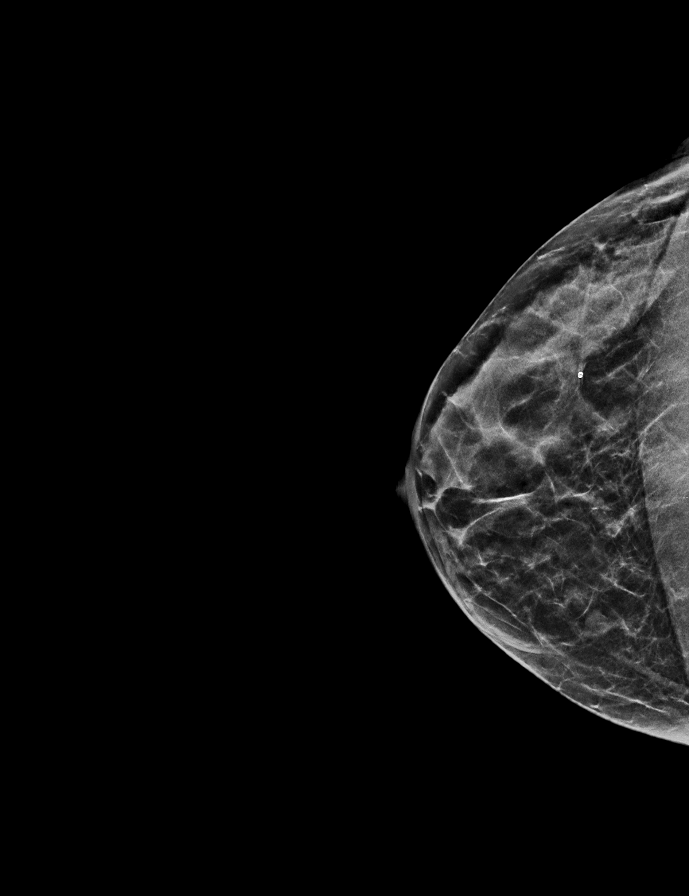

[R CC tomo · tomo slice 22/43.0]
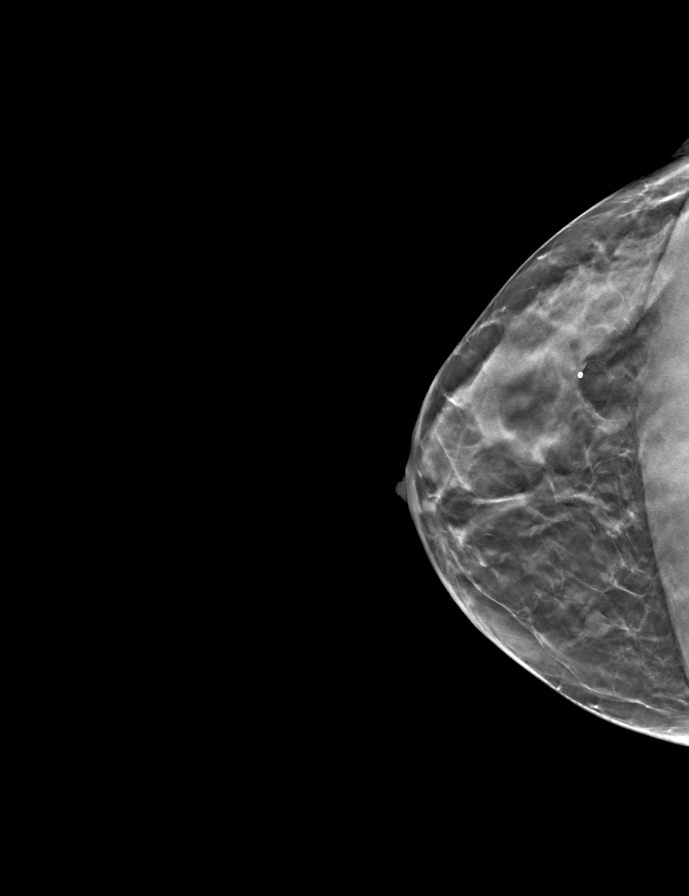

[9 of 28 positions shown; findings below may reference images not displayed]

ACR Breast Density Category c: The breast tissue is heterogeneously
dense, which may obscure small masses.
FINDINGS: In the right breast a possible mass requires further evaluation.

In the left breast a possible mass requires further evaluation.

Images were processed with CAD.
IMPRESSION: Further evaluation is suggested for a possible mass in the right
breast.

Further evaluation is suggested for a possible mass in the left
breast.

RECOMMENDATION:
Diagnostic mammogram and possibly ultrasound of both breasts.
(Code:G6-K-GGR)

The patient will be contacted regarding the findings, and additional
imaging will be scheduled.

BI-RADS CATEGORY  0: Incomplete. Need additional imaging evaluation
and/or prior mammograms for comparison.

## 2018-03-14 ENCOUNTER — Other Ambulatory Visit: Payer: Self-pay | Admitting: Family Medicine

## 2018-04-03 ENCOUNTER — Other Ambulatory Visit: Payer: Self-pay | Admitting: Obstetrics & Gynecology

## 2018-04-03 DIAGNOSIS — Z1231 Encounter for screening mammogram for malignant neoplasm of breast: Secondary | ICD-10-CM

## 2018-05-06 ENCOUNTER — Ambulatory Visit
Admission: RE | Admit: 2018-05-06 | Discharge: 2018-05-06 | Disposition: A | Discharge status: Home / Self Care | Payer: BLUE CROSS/BLUE SHIELD | Source: Ambulatory Visit | Attending: Obstetrics & Gynecology | Admitting: Obstetrics & Gynecology

## 2018-05-06 DIAGNOSIS — Z1231 Encounter for screening mammogram for malignant neoplasm of breast: Secondary | ICD-10-CM

## 2018-05-07 NOTE — Progress Notes (Signed)
Pt has seen results on MyChart and message also sent for patient to call back if any questions.

## 2018-05-08 ENCOUNTER — Ambulatory Visit (INDEPENDENT_AMBULATORY_CARE_PROVIDER_SITE_OTHER): Payer: BLUE CROSS/BLUE SHIELD | Admitting: Obstetrics & Gynecology

## 2018-05-08 ENCOUNTER — Encounter: Payer: Self-pay | Admitting: Obstetrics & Gynecology

## 2018-05-08 VITALS — BP 108/72 | HR 72 | Resp 16 | Ht 66.0 in | Wt 149.0 lb

## 2018-05-08 DIAGNOSIS — Z01419 Encounter for gynecological examination (general) (routine) without abnormal findings: Secondary | ICD-10-CM | POA: Diagnosis not present

## 2018-05-08 DIAGNOSIS — Z1231 Encounter for screening mammogram for malignant neoplasm of breast: Secondary | ICD-10-CM

## 2018-05-08 NOTE — Progress Notes (Signed)
Subjective:     Ruth Garcia is a 47 y.o. female here for a routine exam.  Current complaints: Having regular menses.  No hot flashes.    Gynecologic History Patient's last menstrual period was 04/17/2018. Contraception: vasectomy Last Pap: 2018. Results were: normal Last mammogram: 04/2018. Results were: normal  Obstetric History OB History  Gravida Para Term Preterm AB Living  2 2 2     2   SAB TAB Ectopic Multiple Live Births               # Outcome Date GA Lbr Len/2nd Weight Sex Delivery Anes PTL Lv  2 Term           1 Term              The following portions of the patient's history were reviewed and updated as appropriate: allergies, current medications, past family history, past medical history, past social history, past surgical history and problem list.  Review of Systems Pertinent items noted in HPI and remainder of comprehensive ROS otherwise negative.    Objective:      Vitals:   05/08/18 0822 05/08/18 0826  BP: 112/65 108/72  Pulse: (!) 59 72  Resp:  16  Weight: 149 lb (67.6 kg) 149 lb (67.6 kg)  Height:  5\' 6"  (1.676 m)   Vitals:  WNL General appearance: alert, cooperative and no distress  HEENT: Normocephalic, without obvious abnormality, atraumatic Eyes: negative Throat: lips, mucosa, and tongue normal; teeth and gums normal  Respiratory: Clear to auscultation bilaterally  CV: Regular rate and rhythm  Breasts:  Normal appearance, no masses or tenderness, no nipple retraction or dimpling  GI: Soft, non-tender; bowel sounds normal; no masses,  no organomegaly  GU: External Genitalia:  Tanner V, no lesion Urethra:  No prolapse   Vagina: Pink, normal rugae, no blood or discharge  Cervix: No CMT, no lesion  Uterus:  Normal size and contour, non tender  Adnexa: Normal, no masses, non tender  Musculoskeletal: No edema, redness or tenderness in the calves or thighs  Skin: No lesions or rash  Lymphatic: Axillary adenopathy: none     Psychiatric: Normal  mood and behavior        Assessment:    Healthy female exam.    Plan:   Alternate mammogram and MRI every 6 months. Pap due in 2 years. Colon screening and other health maintenance per Dr. Joellyn Quails. Running marathon in [place]!

## 2018-05-08 NOTE — Addendum Note (Signed)
Addended by: Asencion Islam on: 05/08/2018 02:33 PM   Modules accepted: Orders

## 2018-05-20 ENCOUNTER — Ambulatory Visit: Payer: BLUE CROSS/BLUE SHIELD | Admitting: Family Medicine

## 2018-05-20 ENCOUNTER — Encounter: Payer: Self-pay | Admitting: Family Medicine

## 2018-05-20 VITALS — BP 120/59 | HR 54 | Ht 66.0 in | Wt 151.0 lb

## 2018-05-20 DIAGNOSIS — R55 Syncope and collapse: Secondary | ICD-10-CM

## 2018-05-20 DIAGNOSIS — R001 Bradycardia, unspecified: Secondary | ICD-10-CM | POA: Diagnosis not present

## 2018-05-20 DIAGNOSIS — F418 Other specified anxiety disorders: Secondary | ICD-10-CM

## 2018-05-20 MED ORDER — SERTRALINE HCL 50 MG PO TABS: 75.0000 mg | ORAL_TABLET | Freq: Every day | ORAL | 1 refills | Status: DC

## 2018-05-20 NOTE — Patient Instructions (Signed)
After 2 months if doing well then ok to decrease the sertraline to 50mg .
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
## 2018-05-21 LAB — COMPLETE METABOLIC PANEL WITH GFR
AG Ratio: 1.6 (calc) (ref 1.0–2.5)
ALT: 23 U/L (ref 6–29)
AST: 18 U/L (ref 10–35)
Albumin: 4.2 g/dL (ref 3.6–5.1)
Alkaline phosphatase (APISO): 43 U/L (ref 33–115)
BUN: 12 mg/dL (ref 7–25)
CALCIUM: 9.2 mg/dL (ref 8.6–10.2)
CO2: 29 mmol/L (ref 20–32)
CREATININE: 0.75 mg/dL (ref 0.50–1.10)
Chloride: 102 mmol/L (ref 98–110)
GFR, EST NON AFRICAN AMERICAN: 94 mL/min/{1.73_m2} (ref >=60)
GFR, Est African American: 109 mL/min/{1.73_m2} (ref >=60)
GLUCOSE: 90 mg/dL (ref 65–99)
Globulin: 2.6 g/dL (calc) (ref 1.9–3.7)
Potassium: 4.3 mmol/L (ref 3.5–5.3)
Sodium: 138 mmol/L (ref 135–146)
Total Bilirubin: 0.8 mg/dL (ref 0.2–1.2)
Total Protein: 6.8 g/dL (ref 6.1–8.1)

## 2018-05-21 LAB — CBC
HCT: 40.1 % (ref 35.0–45.0)
Hemoglobin: 13.2 g/dL (ref 11.7–15.5)
MCH: 31.1 pg (ref 27.0–33.0)
MCHC: 32.9 g/dL (ref 32.0–36.0)
MCV: 94.4 fL (ref 80.0–100.0)
MPV: 11.2 fL (ref 7.5–12.5)
PLATELETS: 236 10*3/uL (ref 140–400)
RBC: 4.25 10*6/uL (ref 3.80–5.10)
RDW: 11.8 % (ref 11.0–15.0)
WBC: 4.8 10*3/uL (ref 3.8–10.8)

## 2018-05-21 LAB — TSH: TSH: 2.77 mIU/L

## 2018-05-31 ENCOUNTER — Telehealth: Payer: Self-pay

## 2018-05-31 NOTE — Telephone Encounter (Signed)
Call and spoke with the radiologist over the breast center.  Because she is a BI-RADS 3 and they are following an area in her left breast they really do want her to have her MRI as soon as possible versus trying to staggering with her mammogram for just routine screening.  They did encourage her to schedule about a week after her next menstrual cycle which should start in the next 1 to 2 weeks.  I did call patient and gave her this information and she will schedule as soon as possible.

## 2018-05-31 NOTE — Telephone Encounter (Signed)
Patient had bilateral screening mammogram. When should she have the breast MRI? [facility] states now. Dr Gala Romney states in December.

## 2018-07-30 ENCOUNTER — Encounter: Payer: Self-pay | Admitting: Family Medicine

## 2018-07-30 MED ORDER — ALPRAZOLAM 0.5 MG PO TABS: 0.5000 mg | ORAL_TABLET | Freq: Once | ORAL | 0 refills | Status: DC | PRN

## 2018-08-01 MED ORDER — ALPRAZOLAM 0.5 MG PO TABS: 0.5000 mg | ORAL_TABLET | Freq: Once | ORAL | 0 refills | Status: DC | PRN

## 2018-08-01 NOTE — Addendum Note (Signed)
Addended by: Beatrice Lecher D on: 08/01/2018 12:45 PM   Modules accepted: Orders

## 2018-08-05 ENCOUNTER — Other Ambulatory Visit: Payer: Self-pay | Admitting: Obstetrics & Gynecology

## 2018-08-05 ENCOUNTER — Telehealth: Payer: Self-pay | Admitting: Obstetrics & Gynecology

## 2018-08-05 ENCOUNTER — Encounter: Payer: Self-pay | Admitting: Obstetrics & Gynecology

## 2018-08-05 NOTE — Telephone Encounter (Signed)
47 year old female has extensive family history of breast and ovarian cancer.  Her sister was most recently diagnosed with breast cancer age 56.  Her hereditary breast testing is negative.  Patient had a recent breast biopsy which showed hyperplasia but benign.  Patient was recommended to have short-term follow-up MRI mammogram by Dr. Miquel Dunn.  [employer] denied the test.  They requested to speak with Dr. Miquel Dunn.  Patient would also like a referral to general surgery, Dr. Serita Grammes, to help manage the complexity of her breast imaging and family history.  Coordinator will make referral to Dr. Donne Hazel.

## 2018-08-07 ENCOUNTER — Other Ambulatory Visit: Payer: BLUE CROSS/BLUE SHIELD

## 2018-09-25 DIAGNOSIS — Z803 Family history of malignant neoplasm of breast: Secondary | ICD-10-CM | POA: Diagnosis not present

## 2018-09-25 DIAGNOSIS — Z1239 Encounter for other screening for malignant neoplasm of breast: Secondary | ICD-10-CM | POA: Diagnosis not present

## 2018-09-26 ENCOUNTER — Encounter: Payer: Self-pay | Admitting: *Deleted

## 2018-10-04 DIAGNOSIS — B079 Viral wart, unspecified: Secondary | ICD-10-CM | POA: Diagnosis not present

## 2018-10-04 DIAGNOSIS — D229 Melanocytic nevi, unspecified: Secondary | ICD-10-CM | POA: Diagnosis not present

## 2018-10-04 DIAGNOSIS — L821 Other seborrheic keratosis: Secondary | ICD-10-CM | POA: Diagnosis not present

## 2018-10-10 ENCOUNTER — Telehealth: Payer: Self-pay | Admitting: *Deleted

## 2018-10-10 NOTE — Telephone Encounter (Signed)
Spoke with pt concerning her appt with Dr Donne Hazel and his management of breast surveillence.  She states that she has no questions and is comfortable with Dr Melissa Montane advice.

## 2018-10-10 NOTE — Telephone Encounter (Signed)
-----   Message from Guss Bunde, MD sent at 10/10/2018  9:18 AM EST ----- Hi, Can you call Cami and make sure she is good with the plan for breast surveillance.  Dr. Cristal Generous note outlines it well.  I want to make sure she doesn't have any questions.

## 2018-10-21 IMAGING — US ULTRASOUND LEFT BREAST LIMITED
1 series · 1 of 1 positions shown · non-contrast
Comparison: Previous exams including breast MRI dated 04/06/2017.

ADDENDUM:
Of note, today's right breast ultrasound shows an area of normal
dense fibroglandular tissue within the upper inner quadrant of the
right breast which may correspond to the area of non-mass
enhancement seen on recent breast MRI. Again, no suspicious finding
is identified within the upper right breast by ultrasound. The
MRI-guided biopsy is indicated to ensure benignity.
CLINICAL DATA: Strong family history of breast cancer, including
sister diagnosed with breast cancer at age 47 and 2 maternal aunts
diagnosed in their 40s.

[Series 1: ultrasound left breast limited · 0.06mm/px · 1 of 1 slices shown]
[im 1/1]
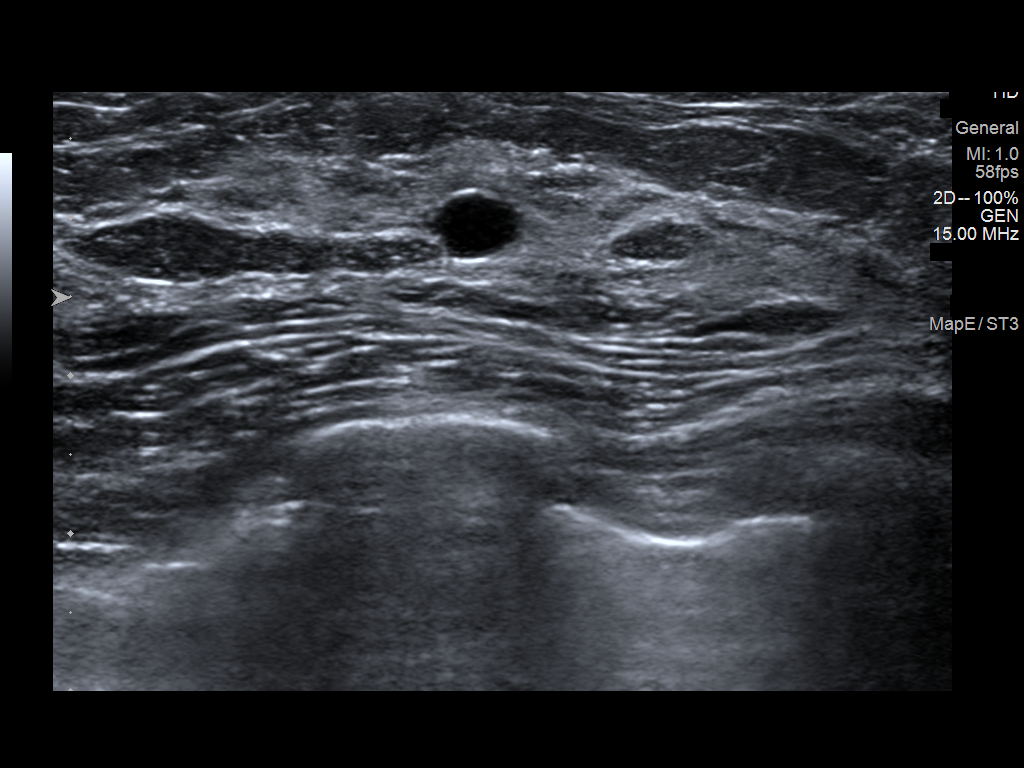

[1 of 1 positions shown; findings below may reference images not displayed]

Recent screening breast MRI showed an area of non-mass enhancement
in the upper inner quadrant of the right breast and a 7 mm mass in
the central portion of the left breast. Patient presents today for
bilateral diagnostic mammogram and breast ultrasound to evaluate for
possible correlate.

EXAM:
2D DIGITAL DIAGNOSTIC BILATERAL MAMMOGRAM WITH CAD AND ADJUNCT TOMO

ULTRASOUND BILATERAL BREAST
ACR Breast Density Category c: The breast tissue is heterogeneously
dense, which may obscure small masses.
FINDINGS: Bilateral 2D CC and MLO projections were obtained today, with
additional 3D tomosynthesis.

There are no new dominant masses, suspicious calcifications or
secondary signs of malignancy within either breast. Specifically,
there is no mammographic abnormality within the upper inner quadrant
of the right breast or within the central outer left breast
corresponding to the suspicious areas described on recent MRI
report.

Mammographic images were processed with CAD.

Targeted ultrasound is performed, showing no sonographic correlate
for the area of non mass enhancement within the upper inner quadrant
of the right breast and no sonographic correlate for the 7 mm
enhancing mass within the central left breast. Incidental note is
made of multiple benign cysts within the central left breast.
IMPRESSION: No mammographic or sonographic evidence of malignancy within either
breast. No mammographic or sonographic correlates for the MRI
findings.

RECOMMENDATION:
Bilateral MRI-guided biopsies.

The MRI-guided biopsies will be scheduled at patient's earliest
convenience.

I have discussed the findings and recommendations with the patient.
Results were also provided in writing at the conclusion of the
visit. If applicable, a reminder letter will be sent to the patient
regarding the next appointment.

BI-RADS CATEGORY  2: Benign. However, MRI guided biopsies were
recommended for non-mass enhancement within the right breast and
oval mass within the left breast.

## 2018-10-21 IMAGING — US ULTRASOUND RIGHT BREAST LIMITED
1 series · 2 of 2 positions shown · non-contrast
Comparison: Previous exams including breast MRI dated 04/06/2017.

ADDENDUM:
Of note, today's right breast ultrasound shows an area of normal
dense fibroglandular tissue within the upper inner quadrant of the
right breast which may correspond to the area of non-mass
enhancement seen on recent breast MRI. Again, no suspicious finding
is identified within the upper right breast by ultrasound. The
MRI-guided biopsy is indicated to ensure benignity.
CLINICAL DATA: Strong family history of breast cancer, including
sister diagnosed with breast cancer at age 47 and 2 maternal aunts
diagnosed in their 40s.

[Series 1: ultrasound right breast limited · 0.06mm/px · 2 of 2 slices shown]
[im 1/2]
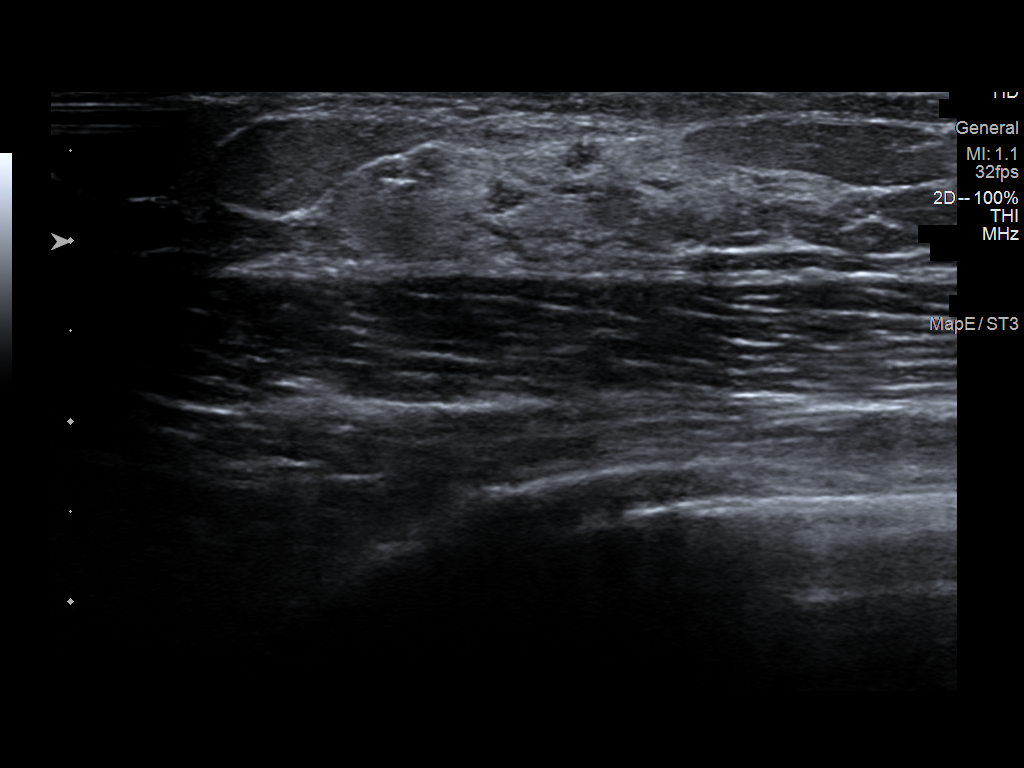
[im 2/2]
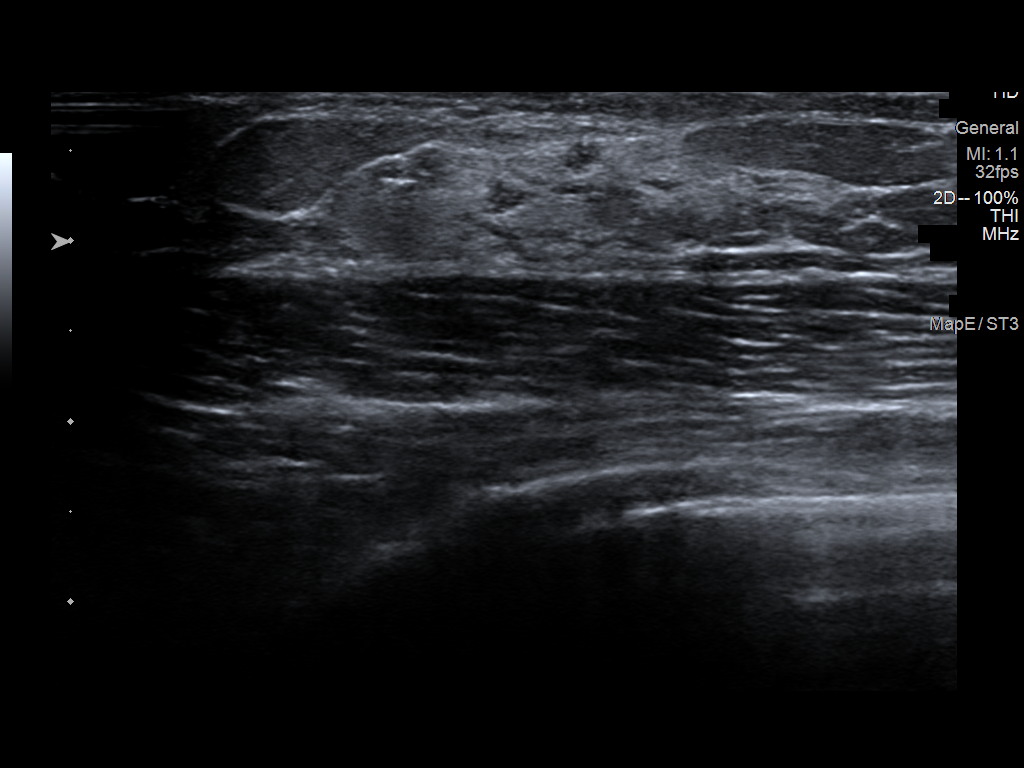

[2 of 2 positions shown; findings below may reference images not displayed]

Recent screening breast MRI showed an area of non-mass enhancement
in the upper inner quadrant of the right breast and a 7 mm mass in
the central portion of the left breast. Patient presents today for
bilateral diagnostic mammogram and breast ultrasound to evaluate for
possible correlate.

EXAM:
2D DIGITAL DIAGNOSTIC BILATERAL MAMMOGRAM WITH CAD AND ADJUNCT TOMO

ULTRASOUND BILATERAL BREAST
ACR Breast Density Category c: The breast tissue is heterogeneously
dense, which may obscure small masses.
FINDINGS: Bilateral 2D CC and MLO projections were obtained today, with
additional 3D tomosynthesis.

There are no new dominant masses, suspicious calcifications or
secondary signs of malignancy within either breast. Specifically,
there is no mammographic abnormality within the upper inner quadrant
of the right breast or within the central outer left breast
corresponding to the suspicious areas described on recent MRI
report.

Mammographic images were processed with CAD.

Targeted ultrasound is performed, showing no sonographic correlate
for the area of non mass enhancement within the upper inner quadrant
of the right breast and no sonographic correlate for the 7 mm
enhancing mass within the central left breast. Incidental note is
made of multiple benign cysts within the central left breast.
IMPRESSION: No mammographic or sonographic evidence of malignancy within either
breast. No mammographic or sonographic correlates for the MRI
findings.

RECOMMENDATION:
Bilateral MRI-guided biopsies.

The MRI-guided biopsies will be scheduled at patient's earliest
convenience.

I have discussed the findings and recommendations with the patient.
Results were also provided in writing at the conclusion of the
visit. If applicable, a reminder letter will be sent to the patient
regarding the next appointment.

BI-RADS CATEGORY  2: Benign. However, MRI guided biopsies were
recommended for non-mass enhancement within the right breast and
oval mass within the left breast.

## 2018-11-04 ENCOUNTER — Ambulatory Visit (INDEPENDENT_AMBULATORY_CARE_PROVIDER_SITE_OTHER): Payer: BC Managed Care – PPO | Admitting: Family Medicine

## 2018-11-04 ENCOUNTER — Encounter: Payer: Self-pay | Admitting: Family Medicine

## 2018-11-04 VITALS — BP 116/52 | HR 76 | Ht 66.0 in | Wt 156.0 lb

## 2018-11-04 DIAGNOSIS — Z803 Family history of malignant neoplasm of breast: Secondary | ICD-10-CM

## 2018-11-04 DIAGNOSIS — F418 Other specified anxiety disorders: Secondary | ICD-10-CM

## 2018-11-04 MED ORDER — SERTRALINE HCL 50 MG PO TABS: 75.0000 mg | ORAL_TABLET | Freq: Every day | ORAL | 1 refills | Status: DC

## 2018-11-04 NOTE — Progress Notes (Signed)
Subjective:    CC: Follow-up mood  HPI:  The 48 year old female comes in today for follow-up anxiety/depression-she is currently on sertraline 50 mg daily.  He was previously on 75 mg.  She ultimately would like to get off of the medication.  She is been just try to take it very slowly.  Has been on that dose for a couple of weeks.  She is a Oncologist at [employer].  She is otherwise doing okay.  She still reports feeling down at times.  No significant anxiety symptoms though does report some irritability.  But rates her symptoms as not difficult at all.  She did have a consultation with Dr. Donne Hazel at [facility] surgery about her risk for breast cancer and breast abnormalities.  They are going to put her on a schedule where she gets a mammogram once a year and a breast MRI at the 20-monthinterval.  And follow-up with him and her OB/GYN to have a clinical exam scheduled for every 6 months.  Past medical history, Surgical history, Family history not pertinant except as noted below, Social history, Allergies, and medications have been entered into the medical record, reviewed, and corrections made.   Review of Systems: No fevers, chills, night sweats, weight loss, chest pain, or shortness of breath.   Objective:    General: Well Developed, well nourished, and in no acute distress.  Neuro: Alert and oriented x3, extra-ocular muscles intact, sensation grossly intact.  HEENT: Normocephalic, atraumatic  Skin: Warm and dry, no rashes. Cardiac: Regular rate and rhythm, no murmurs rubs or gallops, no lower extremity edema.  Respiratory: Clear to auscultation bilaterally. Not using accessory muscles, speaking in full sentences.   Impression and Recommendations:    Anxiety/Depression - she is doing well.  Continue with sertraline 50 mg.  Ultimately she would like to get off the medication but we will take her time trying to wean it.  Right now her PHQ 9 score is 2 which is  actually pretty good and no significant anxiety symptoms which is fantastic.  fam hx of BrCa - now following with Dr. WDonne Hazel

## 2018-11-22 ENCOUNTER — Encounter: Payer: Self-pay | Admitting: Family Medicine

## 2018-12-04 ENCOUNTER — Telehealth: Payer: Self-pay | Admitting: *Deleted

## 2018-12-04 NOTE — Telephone Encounter (Signed)
Returned patient phone call to schedule an appointment.

## 2019-02-17 ENCOUNTER — Telehealth: Payer: Self-pay | Admitting: *Deleted

## 2019-02-17 NOTE — Telephone Encounter (Signed)
Left patient a message to return call from message she left at 9:38am.

## 2019-03-19 ENCOUNTER — Other Ambulatory Visit: Payer: Self-pay | Admitting: General Surgery

## 2019-03-19 DIAGNOSIS — Z1231 Encounter for screening mammogram for malignant neoplasm of breast: Secondary | ICD-10-CM

## 2019-05-05 ENCOUNTER — Other Ambulatory Visit: Payer: Self-pay

## 2019-05-05 ENCOUNTER — Ambulatory Visit (INDEPENDENT_AMBULATORY_CARE_PROVIDER_SITE_OTHER): Payer: BC Managed Care – PPO | Admitting: Family Medicine

## 2019-05-05 ENCOUNTER — Encounter: Payer: Self-pay | Admitting: Family Medicine

## 2019-05-05 VITALS — BP 117/60 | HR 58 | Ht 66.0 in | Wt 150.0 lb

## 2019-05-05 DIAGNOSIS — Z111 Encounter for screening for respiratory tuberculosis: Secondary | ICD-10-CM | POA: Diagnosis not present

## 2019-05-05 DIAGNOSIS — Z0000 Encounter for general adult medical examination without abnormal findings: Secondary | ICD-10-CM | POA: Diagnosis not present

## 2019-05-05 NOTE — Patient Instructions (Signed)

## 2019-05-05 NOTE — Progress Notes (Signed)
Subjective:     Ruth Garcia is a 48 y.o. female and is here for a comprehensive physical exam. The patient reports no problems.  She exercises regularly.  She has not had a period since May but says this is happened before where she stopped her period for a few months and then started again.  She did bring in a form that needs to be completed for work as she is an Building control surveyor.  She will need a TB test performed.  Her tetanus is up-to-date done in March 2015.  She has her mammogram scheduled as well as her OB/GYN visit for later this month.  Social History   Socioeconomic History  . Marital status: Married    Spouse name: Not on file  . Number of children: Not on file  . Years of education: Not on file  . Highest education level: Not on file  Occupational History  . Occupation: Pharmacist, hospital   Social Needs  . Financial resource strain: Not on file  . Food insecurity    Worry: Not on file    Inability: Not on file  . Transportation needs    Medical: Not on file    Non-medical: Not on file  Tobacco Use  . Smoking status: Never Smoker  . Smokeless tobacco: Never Used  Substance and Sexual Activity  . Alcohol use: Yes    Alcohol/week: 1.0 standard drinks    Types: 1 Glasses of wine per week    Comment: WEEKLY  . Drug use: No  . Sexual activity: Yes    Partners: Male    Birth control/protection: Surgical    Comment: husband had vasectomy  Lifestyle  . Physical activity    Days per week: Not on file    Minutes per session: Not on file  . Stress: Not on file  Relationships  . Social Herbalist on phone: Not on file    Gets together: Not on file    Attends religious service: Not on file    Active member of club or organization: Not on file    Attends meetings of clubs or organizations: Not on file    Relationship status: Not on file  . Intimate partner violence    Fear of current or ex partner: Not on file    Emotionally abused: Not on file    Physically abused:  Not on file    Forced sexual activity: Not on file  Other Topics Concern  . Not on file  Social History Narrative   Producer, television/film/video at [employer].   Health Maintenance  Topic Date Due  . MAMMOGRAM  05/07/2019  . INFLUENZA VACCINE  05/17/2019  . PAP SMEAR-Modifier  03/21/2022  . TETANUS/TDAP  12/18/2023  . HIV Screening  Completed    The following portions of the patient's history were reviewed and updated as appropriate: allergies, current medications, past family history, past medical history, past social history, past surgical history and problem list.  Review of Systems A comprehensive review of systems was negative.   Objective:    BP 117/60   Pulse (!) 58   Ht 5\' 6"  (1.676 m)   Wt 150 lb (68 kg)   SpO2 100%   BMI 24.21 kg/m  General appearance: alert, cooperative and appears stated age Head: Normocephalic, without obvious abnormality, atraumatic Eyes: conj clear, EOMI, PEERLA Ears: normal TM's and external ear canals both ears Nose: Nares normal. Septum midline. Mucosa normal. No drainage or sinus tenderness.  Throat: lips, mucosa, and tongue normal; teeth and gums normal Neck: no adenopathy, no carotid bruit, no JVD, supple, symmetrical, trachea midline and thyroid not enlarged, symmetric, no tenderness/mass/nodules Back: symmetric, no curvature. ROM normal. No CVA tenderness. Lungs: clear to auscultation bilaterally Heart: regular rate and rhythm, S1, S2 normal, no murmur, click, rub or gallop Abdomen: soft, non-tender; bowel sounds normal; no masses,  no organomegaly Extremities: extremities normal, atraumatic, no cyanosis or edema Pulses: 2+ and symmetric Skin: Skin color, texture, turgor normal. No rashes or lesions Lymph nodes: Cervical adenopathy: nl and Supraclavicular adenopathy: nl Neurologic: Alert and oriented X 3, normal strength and tone. Normal symmetric reflexes. Normal coordination and gait    Assessment:    Healthy female exam.      Plan:     See After Visit Summary for Counseling Recommendations   Keep up a regular exercise program and make sure you are eating a healthy diet Try to eat 4 servings of dairy a day, or if you are lactose intolerant take a calcium with vitamin D daily.  Your vaccines are up to date.  Mammogram scheduled for later this month. TB skin test ordered.  Will complete form once this is back.

## 2019-05-06 DIAGNOSIS — Z0000 Encounter for general adult medical examination without abnormal findings: Secondary | ICD-10-CM | POA: Diagnosis not present

## 2019-05-06 DIAGNOSIS — Z111 Encounter for screening for respiratory tuberculosis: Secondary | ICD-10-CM | POA: Diagnosis not present

## 2019-05-08 ENCOUNTER — Other Ambulatory Visit: Payer: Self-pay

## 2019-05-08 ENCOUNTER — Ambulatory Visit
Admission: RE | Admit: 2019-05-08 | Discharge: 2019-05-08 | Disposition: A | Discharge status: Home / Self Care | Payer: BC Managed Care – PPO | Source: Ambulatory Visit | Attending: General Surgery | Admitting: General Surgery

## 2019-05-08 ENCOUNTER — Encounter: Payer: Self-pay | Admitting: Family Medicine

## 2019-05-08 DIAGNOSIS — Z1231 Encounter for screening mammogram for malignant neoplasm of breast: Secondary | ICD-10-CM | POA: Diagnosis not present

## 2019-05-08 LAB — COMPLETE METABOLIC PANEL WITH GFR
AG Ratio: 1.7 (calc) (ref 1.0–2.5)
ALT: 14 U/L (ref 6–29)
AST: 14 U/L (ref 10–35)
Albumin: 4.3 g/dL (ref 3.6–5.1)
Alkaline phosphatase (APISO): 41 U/L (ref 31–125)
BUN: 16 mg/dL (ref 7–25)
CO2: 29 mmol/L (ref 20–32)
Calcium: 9.7 mg/dL (ref 8.6–10.2)
Chloride: 103 mmol/L (ref 98–110)
Creat: 0.77 mg/dL (ref 0.50–1.10)
GFR, Est African American: 105 mL/min/{1.73_m2} (ref >=60)
GFR, Est Non African American: 91 mL/min/{1.73_m2} (ref >=60)
Globulin: 2.5 g/dL (calc) (ref 1.9–3.7)
Glucose, Bld: 93 mg/dL (ref 65–99)
Potassium: 4.8 mmol/L (ref 3.5–5.3)
Sodium: 140 mmol/L (ref 135–146)
Total Bilirubin: 0.7 mg/dL (ref 0.2–1.2)
Total Protein: 6.8 g/dL (ref 6.1–8.1)

## 2019-05-08 LAB — CBC
HCT: 44.5 % (ref 35.0–45.0)
Hemoglobin: 14.4 g/dL (ref 11.7–15.5)
MCH: 29.9 pg (ref 27.0–33.0)
MCHC: 32.4 g/dL (ref 32.0–36.0)
MCV: 92.5 fL (ref 80.0–100.0)
MPV: 10.8 fL (ref 7.5–12.5)
Platelets: 257 10*3/uL (ref 140–400)
RBC: 4.81 10*6/uL (ref 3.80–5.10)
RDW: 11.8 % (ref 11.0–15.0)
WBC: 4.1 10*3/uL (ref 3.8–10.8)

## 2019-05-08 LAB — LIPID PANEL
Cholesterol: 179 mg/dL (ref <200)
HDL: 65 mg/dL (ref >=50)
LDL Cholesterol (Calc): 100 mg/dL (calc) — ABNORMAL HIGH
Non-HDL Cholesterol (Calc): 114 mg/dL (calc) (ref <130)
Total CHOL/HDL Ratio: 2.8 (calc) (ref <5.0)
Triglycerides: 59 mg/dL (ref <150)

## 2019-05-08 LAB — QUANTIFERON-TB GOLD PLUS
Mitogen-NIL: >10 IU/mL
NIL: 0.19 IU/mL
QuantiFERON-TB Gold Plus: NEGATIVE
TB1-NIL: <0 IU/mL
TB2-NIL: <0 IU/mL

## 2019-05-08 LAB — TSH: TSH: 3.27 mIU/L

## 2019-05-12 ENCOUNTER — Encounter: Payer: BC Managed Care – PPO | Admitting: Family Medicine

## 2019-05-12 ENCOUNTER — Other Ambulatory Visit: Payer: Self-pay

## 2019-05-12 ENCOUNTER — Ambulatory Visit (INDEPENDENT_AMBULATORY_CARE_PROVIDER_SITE_OTHER): Payer: BC Managed Care – PPO | Admitting: Obstetrics & Gynecology

## 2019-05-12 ENCOUNTER — Encounter: Payer: Self-pay | Admitting: Obstetrics & Gynecology

## 2019-05-12 VITALS — BP 121/61 | HR 64 | Resp 16 | Ht 66.0 in | Wt 152.0 lb

## 2019-05-12 DIAGNOSIS — Z01419 Encounter for gynecological examination (general) (routine) without abnormal findings: Secondary | ICD-10-CM | POA: Diagnosis not present

## 2019-05-12 NOTE — Progress Notes (Signed)
Subjective:     Ruth Garcia is a 48 y.o. female here for a routine exam.  Current complaints: some problems sleeping through the night.  Not waking up sweating but call fall back to sleep better on leather sofa.  No menses since May 2020.  Sees Serita Grammes in December (getting MRI of breast before his visit).    Gynecologic History Patient's last menstrual period was 02/28/2019. Contraception: vasectomy Last Pap: 2018. Results were: normal Last mammogram: July 2020. Results were: normal  Obstetric History OB History  Gravida Para Term Preterm AB Living  2 2 2     2   SAB TAB Ectopic Multiple Live Births               # Outcome Date GA Lbr Len/2nd Weight Sex Delivery Anes PTL Lv  2 Term           1 Term              The following portions of the patient's history were reviewed and updated as appropriate: allergies, current medications, past family history, past medical history, past social history, past surgical history and problem list.  Review of Systems Pertinent items noted in HPI and remainder of comprehensive ROS otherwise negative.    Objective:      Vitals:   05/12/19 0805  BP: 121/61  Pulse: 64  Resp: 16  Weight: 152 lb (68.9 kg)  Height: 5\' 6"  (1.676 m)   Vitals:  WNL General appearance: alert, cooperative and no distress  HEENT: Normocephalic, without obvious abnormality, atraumatic Eyes: negative Throat: lips, mucosa, and tongue normal; teeth and gums normal  Respiratory: Clear to auscultation bilaterally  CV: Regular rate and rhythm  Breasts:  Normal appearance, no masses or tenderness, no nipple retraction or dimpling  GI: Soft, non-tender; bowel sounds normal; no masses,  no organomegaly  GU: External Genitalia:  Tanner V, no lesion Urethra:  No prolapse   Vagina: Pale pink, normal rugae, no blood or discharge  Cervix: No CMT, no lesion  Uterus:  Normal size and contour, non tender  Adnexa: Normal, no masses, non tender  Musculoskeletal: No  edema, redness or tenderness in the calves or thighs  Skin: No lesions or rash  Lymphatic: Axillary adenopathy: none     Psychiatric: Normal mood and behavior        Assessment:    Healthy female exam.   Perimenopausal   Plan:   1.  Pap next year 2.  Mammogram and MRI alternate q 6 months 3.  Watch for symptoms of abnormal bleeding.   4.  Calcium and Vitamin D 5.  Colonoscopy or other testing at 6 (PCP managing) 6.  If sleeping continues to be a problem, Bernece will f/u with Dr. Joellyn Quails.

## 2019-05-18 ENCOUNTER — Encounter: Payer: Self-pay | Admitting: Family Medicine

## 2019-05-19 ENCOUNTER — Other Ambulatory Visit: Payer: Self-pay

## 2019-05-19 ENCOUNTER — Encounter: Payer: BC Managed Care – PPO | Admitting: Family Medicine

## 2019-05-19 ENCOUNTER — Encounter: Payer: Self-pay | Admitting: Physician Assistant

## 2019-05-19 ENCOUNTER — Ambulatory Visit (INDEPENDENT_AMBULATORY_CARE_PROVIDER_SITE_OTHER): Payer: BC Managed Care – PPO | Admitting: Physician Assistant

## 2019-05-19 VITALS — BP 121/74 | HR 65 | Temp 98.3°F | Wt 153.0 lb

## 2019-05-19 DIAGNOSIS — N751 Abscess of Bartholin's gland: Secondary | ICD-10-CM | POA: Diagnosis not present

## 2019-05-19 DIAGNOSIS — L02215 Cutaneous abscess of perineum: Secondary | ICD-10-CM | POA: Insufficient documentation

## 2019-05-19 MED ORDER — HYDROCODONE-ACETAMINOPHEN 5-325 MG PO TABS: 1.0000 | ORAL_TABLET | Freq: Four times a day (QID) | ORAL | 0 refills | Status: AC | PRN

## 2019-05-19 MED ORDER — DOXYCYCLINE HYCLATE 100 MG PO TABS: 100.0000 mg | ORAL_TABLET | Freq: Two times a day (BID) | ORAL | 0 refills | Status: AC

## 2019-05-19 NOTE — Patient Instructions (Signed)
Skin Abscess  A skin abscess is an infected area on or under your skin that contains a collection of pus and other material. An abscess may also be called a furuncle, carbuncle, or boil. An abscess can occur in or on almost any part of your body. Some abscesses break open (rupture) on their own. Most continue to get worse unless they are treated. The infection can spread deeper into the body and eventually into your blood, which can make you feel ill. Treatment usually involves draining the abscess. What are the causes? An abscess occurs when germs, like bacteria, pass through your skin and cause an infection. This may be caused by:  A scrape or cut on your skin.  A puncture wound through your skin, including a needle injection or insect bite.  Blocked oil or sweat glands.  Blocked and infected hair follicles.  A cyst that forms beneath your skin (sebaceous cyst) and becomes infected. What increases the risk? This condition is more likely to develop in people who:  Have a weak body defense system (immune system).  Have diabetes.  Have dry and irritated skin.  Get frequent injections or use illegal IV drugs.  Have a foreign body in a wound, such as a splinter.  Have problems with their lymph system or veins. What are the signs or symptoms? Symptoms of this condition include:  A painful, firm bump under the skin.  A bump with pus at the top. This may break through the skin and drain. Other symptoms include:  Redness surrounding the abscess site.  Warmth.  Swelling of the lymph nodes (glands) near the abscess.  Tenderness.  A sore on the skin. How is this diagnosed? This condition may be diagnosed based on:  A physical exam.  Your medical history.  A sample of pus. This may be used to find out what is causing the infection.  Blood tests.  Imaging tests, such as an ultrasound, CT scan, or MRI. How is this treated? A small abscess that drains on its own may not  need treatment. Treatment for larger abscesses may include:  Moist heat or heat pack applied to the area several times a day.  A procedure to drain the abscess (incision and drainage).  Antibiotic medicines. For a severe abscess, you may first get antibiotics through an IV and then change to antibiotics by mouth. Follow these instructions at home: Medicines   Take over-the-counter and prescription medicines only as told by your health care provider.  If you were prescribed an antibiotic medicine, take it as told by your health care provider. Do not stop taking the antibiotic even if you start to feel better. Abscess care   If you have an abscess that has not drained, apply heat to the affected area. Use the heat source that your health care provider recommends, such as a moist heat pack or a heating pad. ? Place a towel between your skin and the heat source. ? Leave the heat on for 20-30 minutes. ? Remove the heat if your skin turns bright red. This is especially important if you are unable to feel pain, heat, or cold. You may have a greater risk of getting burned.  Follow instructions from your health care provider about how to take care of your abscess. Make sure you: ? Cover the abscess with a bandage (dressing). ? Change your dressing or gauze as told by your health care provider. ? Wash your hands with soap and water before you change the   dressing or gauze. If soap and water are not available, use hand sanitizer.  Check your abscess every day for signs of a worsening infection. Check for: ? More redness, swelling, or pain. ? More fluid or blood. ? Warmth. ? More pus or a bad smell. General instructions  To avoid spreading the infection: ? Do not share personal care items, towels, or hot tubs with others. ? Avoid making skin contact with other people.  Keep all follow-up visits as told by your health care provider. This is important. Contact a health care provider if you  have:  More redness, swelling, or pain around your abscess.  More fluid or blood coming from your abscess.  Warm skin around your abscess.  More pus or a bad smell coming from your abscess.  A fever.  Muscle aches.  Chills or a general ill feeling. Get help right away if you:  Have severe pain.  See red streaks on your skin spreading away from the abscess. Summary  A skin abscess is an infected area on or under your skin that contains a collection of pus and other material.  A small abscess that drains on its own may not need treatment.  Treatment for larger abscesses may include having a procedure to drain the abscess and taking an antibiotic. This information is not intended to replace advice given to you by your health care provider. Make sure you discuss any questions you have with your health care provider. Document Released: 07/12/2005 Document Revised: 01/23/2019 Document Reviewed: 11/15/2017 Elsevier Patient Education  2020 Else[employer] Inc[employer]  

## 2019-05-19 NOTE — Progress Notes (Signed)
HPI:                                                                Ruth Garcia is a 48 y.o. female who presents to [facility] today for "?cyst"  Patient reports new onset pain/swelling/redness of her vulvovaginal area approx 5 days ago (Wednesday). States symptoms became worse after going for a run yesterday.  Denies fever, chills, malaise. Denies drainage. No prior hx of MRSA or abscess.   No past medical history on file. Past Surgical History:  Procedure Laterality Date  . BREAST BIOPSY Bilateral 04/2017   benign  . URETHRAL CYST REMOVAL     Social History   Tobacco Use  . Smoking status: Never Smoker  . Smokeless tobacco: Never Used  Substance Use Topics  . Alcohol use: Yes    Alcohol/week: 1.0 standard drinks    Types: 1 Glasses of wine per week    Comment: WEEKLY   family history includes Breast cancer in her maternal aunt and sister; Cancer in her maternal aunt and maternal uncle; Thyroid disease in her daughter and mother.    ROS: negative except as noted in the HPI  Medications: Current Outpatient Medications  Medication Sig Dispense Refill  . AMBULATORY NON FORMULARY MEDICATION Curcumin ( from Tumeric root) 1000mg  a day    . Calcium Carbonate-Vitamin D (CALCIUM + D PO) Take 1 tablet by mouth daily.    . Cholecalciferol 2000 units CAPS Take 1 capsule by mouth daily.    . fish oil-omega-3 fatty acids 1000 MG capsule Take 2 g by mouth daily.    . Multiple Vitamin (MULTIVITAMIN) capsule Take 1 capsule by mouth daily.    . Selenium 200 MCG CAPS Take by mouth.    . sertraline (ZOLOFT) 50 MG tablet Take 1.5 tablets (75 mg total) by mouth daily. 135 tablet 1  . vitamin C (ASCORBIC ACID) 500 MG tablet Take 500 mg by mouth daily.    . vitamin E 400 UNIT capsule Take 400 Units by mouth daily.    Marland Kitchen doxycycline (VIBRA-TABS) 100 MG tablet Take 1 tablet (100 mg total) by mouth 2 (two) times daily for 7 days. 14 tablet 0   . HYDROcodone-acetaminophen (NORCO/VICODIN) 5-325 MG tablet Take 1 tablet by mouth every 6 (six) hours as needed for up to 3 days for moderate pain or severe pain. 12 tablet 0   No current facility-administered medications for this visit.    No Known Allergies     Objective:  BP 121/74   Pulse 65   Temp 98.3 F (36.8 C) (Oral)   Wt 153 lb (69.4 kg)   BMI 24.69 kg/m  Physical Exam Vitals signs reviewed. Exam conducted with a chaperone present.  Constitutional:      General: She is not in acute distress.    Appearance: Normal appearance. She is not ill-appearing.  HENT:     Head: Normocephalic and atraumatic.  Pulmonary:     Effort: Pulmonary effort is normal.  Genitourinary:    Rectum: Normal.    Musculoskeletal:     Right lower leg: No edema.     Left lower leg: No edema.  Skin:    General: Skin is warm and dry.  Findings: Abscess (R perineum) present.  Neurological:     Mental Status: She is alert and oriented to person, place, and time.  Psychiatric:        Behavior: Behavior normal.        Thought Content: Thought content normal.     Procedure:  Incision and drainage of abscess. Risks, benefits, and alternatives explained and consent obtained. Time out conducted. Surface cleaned with alcohol. 3.5 cc lidocaine with epinephine infiltrated around abscess. Adequate anesthesia ensured. Area prepped and draped in a sterile fashion. #11 blade used to make a stab incision into abscess. Pus expressed with pressure. Curved hemostat used to explore 4 quadrants and loculations broken up. Further purulence expressed. 2 inches of iodoform packing placed leaving a 1-inch tail. Hemostasis achieved. Patient tolerated procedure without immediate complications.  No results found for this or any previous visit (from the past 72 hour(s)). No results found.    Assessment and Plan: 48 y.o. female with   .Ruth Garcia was seen today for cyst.  Diagnoses and all orders  for this visit:  Cutaneous abscess of perineum -     doxycycline (VIBRA-TABS) 100 MG tablet; Take 1 tablet (100 mg total) by mouth 2 (two) times daily for 7 days. -     HYDROcodone-acetaminophen (NORCO/VICODIN) 5-325 MG tablet; Take 1 tablet by mouth every 6 (six) hours as needed for up to 3 days for moderate pain or severe pain. -     Wound culture   I&D performed in office today Wound cx pending Start Doxcycline Tylenol 1000 mg Q8H prn for pain management. Norco for severe pain Recommend close follow-up with GYN tomorrow    Patient education and anticipatory guidance given Patient agrees with treatment plan Follow-up as needed if symptoms worsen or fail to improve  Darlyne Russian PA-C

## 2019-05-21 ENCOUNTER — Ambulatory Visit (INDEPENDENT_AMBULATORY_CARE_PROVIDER_SITE_OTHER): Payer: BC Managed Care – PPO | Admitting: Physician Assistant

## 2019-05-21 ENCOUNTER — Encounter: Payer: Self-pay | Admitting: Physician Assistant

## 2019-05-21 ENCOUNTER — Other Ambulatory Visit: Payer: Self-pay

## 2019-05-21 VITALS — BP 113/69 | HR 63 | Temp 97.9°F

## 2019-05-21 DIAGNOSIS — L02215 Cutaneous abscess of perineum: Secondary | ICD-10-CM

## 2019-05-21 DIAGNOSIS — N9089 Other specified noninflammatory disorders of vulva and perineum: Secondary | ICD-10-CM

## 2019-05-21 NOTE — Progress Notes (Signed)
HPI:                                                                Ruth Garcia is a 48 y.o. female who presents to [facility] today for I&D follow-up  05/21/19 Patient underwent I&D 2 days ago of right perineal area just posterior to the right labia majora Reports packing fell out yesterday She reports pain and swelling has improved and there has been no additional drainage She is currently taking Doxyc Preliminary wound culture shows moderate polymorphonuclear leukocytes, no organisms  05/19/19 Patient reports new onset pain/swelling/redness of her vulvovaginal area approx 5 days ago (Wednesday). States symptoms became worse after going for a run yesterday.  Denies fever, chills, malaise. Denies drainage. No prior hx of MRSA or abscess.   No past medical history on file. Past Surgical History:  Procedure Laterality Date  . BREAST BIOPSY Bilateral 04/2017   benign  . URETHRAL CYST REMOVAL     Social History   Tobacco Use  . Smoking status: Never Smoker  . Smokeless tobacco: Never Used  Substance Use Topics  . Alcohol use: Yes    Alcohol/week: 1.0 standard drinks    Types: 1 Glasses of wine per week    Comment: WEEKLY   family history includes Breast cancer in her maternal aunt and sister; Cancer in her maternal aunt and maternal uncle; Thyroid disease in her daughter and mother.    ROS: negative except as noted in the HPI  Medications: Current Outpatient Medications  Medication Sig Dispense Refill  . AMBULATORY NON FORMULARY MEDICATION Curcumin ( from Tumeric root) 1000mg  a day    . Calcium Carbonate-Vitamin D (CALCIUM + D PO) Take 1 tablet by mouth daily.    . Cholecalciferol 2000 units CAPS Take 1 capsule by mouth daily.    Marland Kitchen doxycycline (VIBRA-TABS) 100 MG tablet Take 1 tablet (100 mg total) by mouth 2 (two) times daily for 7 days. 14 tablet 0  . fish oil-omega-3 fatty acids 1000 MG capsule Take 2 g by mouth  daily.    Marland Kitchen HYDROcodone-acetaminophen (NORCO/VICODIN) 5-325 MG tablet Take 1 tablet by mouth every 6 (six) hours as needed for up to 3 days for moderate pain or severe pain. 12 tablet 0  . Multiple Vitamin (MULTIVITAMIN) capsule Take 1 capsule by mouth daily.    . Selenium 200 MCG CAPS Take by mouth.    . sertraline (ZOLOFT) 50 MG tablet Take 1.5 tablets (75 mg total) by mouth daily. 135 tablet 1  . vitamin C (ASCORBIC ACID) 500 MG tablet Take 500 mg by mouth daily.    . vitamin E 400 UNIT capsule Take 400 Units by mouth daily.     No current facility-administered medications for this visit.    No Known Allergies     Objective:  BP 113/69   Pulse 63   Temp 97.9 F (36.6 C) (Oral)  Physical Exam Vitals signs reviewed. Exam conducted with a chaperone present.  Constitutional:      General: She is not in acute distress.    Appearance: Normal appearance. She is not ill-appearing.  HENT:     Head: Normocephalic and atraumatic.  Pulmonary:     Effort: Pulmonary effort is normal.  Genitourinary:    Rectum: Normal.    Musculoskeletal:     Right lower leg: No edema.     Left lower leg: No edema.  Skin:    General: Skin is warm and dry.     Findings: Abscess (R perineum) present.  Neurological:     Mental Status: She is alert and oriented to person, place, and time.  Psychiatric:        Behavior: Behavior normal.        Thought Content: Thought content normal.       Results for orders placed or performed in visit on 05/19/19 (from the past 72 hour(s))  Wound culture     Status: None (Preliminary result)   Collection Time: 05/19/19 11:12 AM   Specimen: Wound  Result Value Ref Range   MICRO NUMBER: [id number]    SPECIMEN QUALITY: Adequate    SOURCE: R LABIAL    STATUS: PRELIMINARY    GRAM STAIN:      Moderate Polymorphonuclear leukocytes Few epithelial cells No organisms seen   No results found.    Assessment and Plan: 48 y.o. female with   .[name] was seen today  for follow-up.  Diagnoses and all orders for this visit:  Cutaneous abscess of perineum  Perineal cyst in female   Preliminary wound culture does not suggest bacterial infection. Suspect this may be an inflamed epidermoid cyst Continue Doxcycline Keep follow-up appt with GYN tomorrow    Patient education and anticipatory guidance given Patient agrees with treatment plan Follow-up as needed if symptoms worsen or fail to improve  Darlyne Russian PA-C

## 2019-05-22 ENCOUNTER — Encounter: Payer: Self-pay | Admitting: Obstetrics & Gynecology

## 2019-05-22 ENCOUNTER — Ambulatory Visit (INDEPENDENT_AMBULATORY_CARE_PROVIDER_SITE_OTHER): Payer: BC Managed Care – PPO | Admitting: Obstetrics & Gynecology

## 2019-05-22 VITALS — BP 124/67 | HR 66 | Ht 65.0 in | Wt 154.0 lb

## 2019-05-22 DIAGNOSIS — L02215 Cutaneous abscess of perineum: Secondary | ICD-10-CM

## 2019-05-22 LAB — WOUND CULTURE
MICRO NUMBER:: 730469
RESULT:: NO GROWTH
SPECIMEN QUALITY:: ADEQUATE

## 2019-05-22 NOTE — Progress Notes (Signed)
   Subjective:    Patient ID: Ruth Garcia, female    DOB: 1971-04-09, 48 y.o.   MRN: [id number]  HPI 48 yo lady here for a recheck of a perineal abscess. She had it drained on 05-19-19 by Dr. Maisie Fus. She is taking doxy. She reports that she feels much improved.   Review of Systems  The culture did not grow anything.  Objective:   Physical Exam Breathing, conversing, and ambulating normally Well nourished, well hydrated White female, no apparent distress Vulva- normal On the right perineum there is evidence of a previous incision, healing well There is an area 2 cm x 2 cm of non-tender induration. I am unable to tell if this is resolving or developing.    Assessment & Plan:  H/o perineal abscess- I rec'd that she continue doxy until prescription is finished. She will come back here if the area grows/becomes more painful. She can return to her normal activities of daily living

## 2019-05-30 ENCOUNTER — Other Ambulatory Visit: Payer: Self-pay | Admitting: Family Medicine

## 2019-05-30 DIAGNOSIS — F418 Other specified anxiety disorders: Secondary | ICD-10-CM

## 2019-06-27 DIAGNOSIS — E7800 Pure hypercholesterolemia, unspecified: Secondary | ICD-10-CM | POA: Insufficient documentation

## 2019-08-27 ENCOUNTER — Other Ambulatory Visit: Payer: Self-pay | Admitting: General Surgery

## 2019-08-27 DIAGNOSIS — Z803 Family history of malignant neoplasm of breast: Secondary | ICD-10-CM

## 2019-09-16 ENCOUNTER — Other Ambulatory Visit: Payer: Self-pay | Admitting: General Surgery

## 2019-09-19 ENCOUNTER — Other Ambulatory Visit: Payer: Self-pay

## 2019-09-19 ENCOUNTER — Ambulatory Visit
Admission: RE | Admit: 2019-09-19 | Discharge: 2019-09-19 | Disposition: A | Discharge status: Home / Self Care | Payer: BC Managed Care – PPO | Source: Ambulatory Visit | Attending: General Surgery | Admitting: General Surgery

## 2019-09-19 DIAGNOSIS — Z1239 Encounter for other screening for malignant neoplasm of breast: Secondary | ICD-10-CM | POA: Diagnosis not present

## 2019-09-19 DIAGNOSIS — Z803 Family history of malignant neoplasm of breast: Secondary | ICD-10-CM | POA: Diagnosis not present

## 2019-09-19 MED ORDER — GADOBUTROL 1 MMOL/ML IV SOLN
7.0000 mL | Freq: Once | INTRAVENOUS | Status: AC | PRN
  Administered 2019-09-19: 7 mL via INTRAVENOUS

## 2019-09-24 DIAGNOSIS — Z803 Family history of malignant neoplasm of breast: Secondary | ICD-10-CM | POA: Diagnosis not present

## 2019-11-13 DIAGNOSIS — Z20822 Contact with and (suspected) exposure to covid-19: Secondary | ICD-10-CM | POA: Diagnosis not present

## 2019-12-22 ENCOUNTER — Ambulatory Visit (INDEPENDENT_AMBULATORY_CARE_PROVIDER_SITE_OTHER): Payer: BC Managed Care – PPO | Admitting: Family Medicine

## 2019-12-22 ENCOUNTER — Other Ambulatory Visit: Payer: Self-pay

## 2019-12-22 ENCOUNTER — Encounter: Payer: Self-pay | Admitting: Family Medicine

## 2019-12-22 VITALS — BP 120/56 | HR 67 | Ht 66.0 in | Wt 157.0 lb

## 2019-12-22 DIAGNOSIS — M25561 Pain in right knee: Secondary | ICD-10-CM | POA: Diagnosis not present

## 2019-12-22 MED ORDER — IBUPROFEN 600 MG PO TABS: 600.0000 mg | ORAL_TABLET | Freq: Three times a day (TID) | ORAL | 1 refills | Status: DC | PRN

## 2019-12-22 NOTE — Progress Notes (Signed)
Established Patient Office Visit  Subjective:  Patient ID: Ruth Garcia, female    DOB: 1971-06-11  Age: 49 y.o. MRN: [id number]  CC:  Chief Complaint  Patient presents with  . Knee Pain    HPI Ruth Garcia presents for   Pt states injury to right knee while running x 3 weeks prior.  She had not been running recently and decided to get on the treadmill for about 20 minutes.  Location: outside and above knee cap.  She did have some swelling initially over that upper outer knee area but it has been getting a little bit better.  Pt has been stretching and strength training knee since injury. Feeling some better. Using ibuprofen, heat, and ice. Stiff in the am.  Is also been using some KT tape and try to keep it elevated.  She has been trying to do some IT band work particularly with some yoga stretches she is also been trying to do some quadriceps quadricep and glutes strengthening.  She found some old ibuprofen 600 mg last week that she had for a dental procedure and says she has been using that and it actually has really helped her pain.  She feels like the knee is about 50% better but it had been 3 weeks to just wanted to come in and make sure that she was on the right track. Hard to get up and down off the floor.    No past medical history on file.  Past Surgical History:  Procedure Laterality Date  . BREAST BIOPSY Bilateral 04/2017   benign  . URETHRAL CYST REMOVAL      Family History  Problem Relation Age of Onset  . Thyroid disease Mother   . Thyroid disease Daughter   . Cancer Maternal Aunt        2 with breast and one with ovarian  . Breast cancer Maternal Aunt        x2  . Cancer Maternal Uncle        colon  . Breast cancer Sister     Social History   Socioeconomic History  . Marital status: Married    Spouse name: Not on file  . Number of children: Not on file  . Years of education: Not on file  . Highest education level: Not on file  Occupational  History  . Occupation: Pharmacist, hospital   Tobacco Use  . Smoking status: Never Smoker  . Smokeless tobacco: Never Used  Substance and Sexual Activity  . Alcohol use: Yes    Alcohol/week: 1.0 standard drinks    Types: 1 Glasses of wine per week    Comment: WEEKLY  . Drug use: No  . Sexual activity: Yes    Partners: Male    Birth control/protection: Surgical    Comment: husband had vasectomy  Other Topics Concern  . Not on file  Social History Narrative   Producer, television/film/video at [employer].   Social Determinants of Health   Financial Resource Strain:   . Difficulty of Paying Living Expenses: Not on file  Food Insecurity:   . Worried About Charity fundraiser in the Last Year: Not on file  . Ran Out of Food in the Last Year: Not on file  Transportation Needs:   . Lack of Transportation (Medical): Not on file  . Lack of Transportation (Non-Medical): Not on file  Physical Activity:   . Days of Exercise per Week: Not on file  . Minutes of Exercise  per Session: Not on file  Stress:   . Feeling of Stress : Not on file  Social Connections:   . Frequency of Communication with Friends and Family: Not on file  . Frequency of Social Gatherings with Friends and Family: Not on file  . Attends Religious Services: Not on file  . Active Member of Clubs or Organizations: Not on file  . Attends Archivist Meetings: Not on file  . Marital Status: Not on file  Intimate Partner Violence:   . Fear of Current or Ex-Partner: Not on file  . Emotionally Abused: Not on file  . Physically Abused: Not on file  . Sexually Abused: Not on file    Outpatient Medications Prior to Visit  Medication Sig Dispense Refill  . AMBULATORY NON FORMULARY MEDICATION Curcumin ( from Tumeric root) 1000mg  a day    . Calcium Carbonate-Vitamin D (CALCIUM + D PO) Take 1 tablet by mouth daily.    . Cholecalciferol 2000 units CAPS Take 1 capsule by mouth daily.    . fish oil-omega-3 fatty acids 1000 MG capsule  Take 2 g by mouth daily.    . Multiple Vitamin (MULTIVITAMIN) capsule Take 1 capsule by mouth daily.    . Selenium 200 MCG CAPS Take by mouth.    . sertraline (ZOLOFT) 50 MG tablet TAKE 1.5 TABLETS BY MOUTH DAILY 135 tablet 1  . vitamin C (ASCORBIC ACID) 500 MG tablet Take 500 mg by mouth daily.    . vitamin E 400 UNIT capsule Take 400 Units by mouth daily.     No facility-administered medications prior to visit.    No Known Allergies  ROS Review of Systems    Objective:    Physical Exam  Constitutional: She is oriented to person, place, and time. She appears well-developed and well-nourished.  HENT:  Head: Normocephalic and atraumatic.  Eyes: Conjunctivae and EOM are normal.  Cardiovascular: Normal rate.  Pulmonary/Chest: Effort normal.  Musculoskeletal:     Comments: Right knee with normal flexion extension.  Pilar Plate is 5-5 at the hip knee and ankle.  Patellar reflexes 1+ bilaterally.  She does have some mild tenderness along the medial joint line a little bit of crepitus with flexion behind the patella.  Negative anterior drawer test.  No increased laxity or pain with valgus and varus stress.  Negative McMurray's.  Some pain over that upper outer knee with full flexion.  Neurological: She is alert and oriented to person, place, and time.  Skin: Skin is dry. No pallor.  Psychiatric: She has a normal mood and affect. Her behavior is normal.  Vitals reviewed.   BP (!) 120/56   Pulse 67   Ht 5\' 6"  (1.676 m)   Wt 157 lb (71.2 kg)   SpO2 98%   BMI 25.34 kg/m  Wt Readings from Last 3 Encounters:  12/22/19 157 lb (71.2 kg)  05/22/19 154 lb (69.9 kg)  05/19/19 153 lb (69.4 kg)     Health Maintenance Due  Topic Date Due  . COLONOSCOPY  11/29/2019    There are no preventive care reminders to display for this patient.  Lab Results  Component Value Date   TSH 3.27 05/06/2019   Lab Results  Component Value Date   WBC 4.1 05/06/2019   HGB 14.4 05/06/2019   HCT 44.5  05/06/2019   MCV 92.5 05/06/2019   PLT 257 05/06/2019   Lab Results  Component Value Date   NA 140 05/06/2019   K 4.8 05/06/2019  CO2 29 05/06/2019   GLUCOSE 93 05/06/2019   BUN 16 05/06/2019   CREATININE 0.77 05/06/2019   BILITOT 0.7 05/06/2019   ALKPHOS 42 05/23/2017   AST 14 05/06/2019   ALT 14 05/06/2019   PROT 6.8 05/06/2019   ALBUMIN 4.1 05/23/2017   CALCIUM 9.7 05/06/2019   Lab Results  Component Value Date   CHOL 179 05/06/2019   Lab Results  Component Value Date   HDL 65 05/06/2019   Lab Results  Component Value Date   LDLCALC 100 (H) 05/06/2019   Lab Results  Component Value Date   TRIG 59 05/06/2019   Lab Results  Component Value Date   CHOLHDL 2.8 05/06/2019   No results found for: HGBA1C    Assessment & Plan:   Problem List Items Addressed This Visit    None    Visit Diagnoses    Acute pain of right knee    -  Primary     Right knee pain -continue with home therapy with stretches and quadricep and hamstring strengthening as well as IT band work.  Send over prescription for ibuprofen 600 mg.  Avoid if any GI irritation or upset.  Continue heat and ice since this does seem to be helpful.  If not continuing to improve each week then we will do further work-up with getting her in with sports med and possible x-ray.  She did let me know she did get her first [employer] vaccine and will get that updated in her chart.  Meds ordered this encounter  Medications  . ibuprofen (ADVIL) 600 MG tablet    Sig: Take 1 tablet (600 mg total) by mouth every 8 (eight) hours as needed for moderate pain.    Dispense:  45 tablet    Refill:  1    Follow-up: No follow-ups on file.   Time spent 25 minutes.   Beatrice Lecher, MD

## 2019-12-22 NOTE — Progress Notes (Signed)
Pt states injury to right knee while running x 3 weeks prior. Location: outside and above knee cap.  Pt has been stretching and strength training knee since injury. Feeling some better.  Using ibuprofen, heat, and ice.   Stiff in the am.  Hard to get up and down off the floor.

## 2020-03-26 ENCOUNTER — Other Ambulatory Visit: Payer: Self-pay | Admitting: Obstetrics & Gynecology

## 2020-03-26 DIAGNOSIS — Z1231 Encounter for screening mammogram for malignant neoplasm of breast: Secondary | ICD-10-CM

## 2020-05-05 ENCOUNTER — Encounter: Payer: Self-pay | Admitting: Family Medicine

## 2020-05-05 ENCOUNTER — Ambulatory Visit (INDEPENDENT_AMBULATORY_CARE_PROVIDER_SITE_OTHER): Payer: BC Managed Care – PPO | Admitting: Family Medicine

## 2020-05-05 VITALS — BP 116/45 | HR 59 | Ht 66.0 in | Wt 153.0 lb

## 2020-05-05 DIAGNOSIS — Z0000 Encounter for general adult medical examination without abnormal findings: Secondary | ICD-10-CM | POA: Diagnosis not present

## 2020-05-05 DIAGNOSIS — Z1211 Encounter for screening for malignant neoplasm of colon: Secondary | ICD-10-CM | POA: Diagnosis not present

## 2020-05-05 NOTE — Progress Notes (Signed)
Subjective:     Ruth Garcia is a 49 y.o. female and is here for a comprehensive physical exam. The patient reports no problems.  Doing well overall.  Still exercising 5 days/week.  She recently turned 66 this year so we need to discuss colon cancer screening she does have a maternal uncle who was diagnosed in his 69s.  She has had her full Covid vaccination.  Social History   Socioeconomic History  . Marital status: Married    Spouse name: Not on file  . Number of children: Not on file  . Years of education: Not on file  . Highest education level: Not on file  Occupational History  . Occupation: Pharmacist, hospital   Tobacco Use  . Smoking status: Never Smoker  . Smokeless tobacco: Never Used  Substance and Sexual Activity  . Alcohol use: Yes    Alcohol/week: 1.0 standard drink    Types: 1 Glasses of wine per week    Comment: WEEKLY  . Drug use: No  . Sexual activity: Yes    Partners: Male    Birth control/protection: Surgical    Comment: husband had vasectomy  Other Topics Concern  . Not on file  Social History Narrative   Producer, television/film/video at [employer].   Social Determinants of Health   Financial Resource Strain:   . Difficulty of Paying Living Expenses:   Food Insecurity:   . Worried About Charity fundraiser in the Last Year:   . Arboriculturist in the Last Year:   Transportation Needs:   . Film/video editor (Medical):   Marland Kitchen Lack of Transportation (Non-Medical):   Physical Activity:   . Days of Exercise per Week:   . Minutes of Exercise per Session:   Stress:   . Feeling of Stress :   Social Connections:   . Frequency of Communication with Friends and Family:   . Frequency of Social Gatherings with Friends and Family:   . Attends Religious Services:   . Active Member of Clubs or Organizations:   . Attends Archivist Meetings:   Marland Kitchen Marital Status:   Intimate Partner Violence:   . Fear of Current or Ex-Partner:   . Emotionally Abused:   Marland Kitchen  Physically Abused:   . Sexually Abused:    Health Maintenance  Topic Date Due  . Hepatitis C Screening  Never done  . COLONOSCOPY  Never done  . MAMMOGRAM  05/07/2020  . INFLUENZA VACCINE  05/16/2020  . PAP SMEAR-Modifier  03/21/2022  . TETANUS/TDAP  12/18/2023  . COVID-19 Vaccine  Completed  . HIV Screening  Completed    The following portions of the patient's history were reviewed and updated as appropriate: allergies, current medications, past family history, past medical history, past social history, past surgical history and problem list.  Review of Systems A comprehensive review of systems was negative.   Objective:    BP (!) 116/45   Pulse (!) 59   Ht 5\' 6"  (1.676 m)   Wt 153 lb (69.4 kg)   SpO2 99%   BMI 24.69 kg/m  General appearance: alert, cooperative and appears stated age Head: Normocephalic, without obvious abnormality, atraumatic Eyes: conj clear, EOMI, PEERLA Ears: normal TM's and external ear canals both ears Nose: Nares normal. Septum midline. Mucosa normal. No drainage or sinus tenderness. Throat: lips, mucosa, and tongue normal; teeth and gums normal Neck: no adenopathy, no carotid bruit, no JVD, supple, symmetrical, trachea midline and thyroid not enlarged,  symmetric, no tenderness/mass/nodules Back: symmetric, no curvature. ROM normal. No CVA tenderness. Lungs: clear to auscultation bilaterally Heart: regular rate and rhythm, S1, S2 normal, no murmur, click, rub or gallop Abdomen: soft, non-tender; bowel sounds normal; no masses,  no organomegaly Extremities: extremities normal, atraumatic, no cyanosis or edema Pulses: 2+ and symmetric Skin: Skin color, texture, turgor normal. No rashes or lesions Lymph nodes: Cervical, supraclavicular, and axillary nodes normal. Neurologic: Alert and oriented X 3, normal strength and tone. Normal symmetric reflexes. Normal coordination and gait    Assessment:    Healthy female exam.     Plan:     See After  Visit Summary for Counseling Recommendations   Keep up a regular exercise program and make sure you are eating a healthy diet Try to eat 4 servings of dairy a day, or if you are lactose intolerant take a calcium with vitamin D daily.  Your vaccines are up to date.  Discussed colon cancer screening guidelines recommend referral for colonoscopy.  Patient agreed. Gust shingles vaccine.  She says she is willing to get it but just does not want to do it today additional handout information provided. We will do hepatitis C screening on her blood work.

## 2020-05-05 NOTE — Patient Instructions (Signed)
Health Maintenance, Female Adopting a healthy lifestyle and getting preventive care are important in promoting health and wellness. Ask your health care provider about:  The right schedule for you to have regular tests and exams.  Things you can do on your own to prevent diseases and keep yourself healthy. What should I know about diet, weight, and exercise? Eat a healthy diet   Eat a diet that includes plenty of vegetables, fruits, low-fat dairy products, and lean protein.  Do not eat a lot of foods that are high in solid fats, added sugars, or sodium. Maintain a healthy weight Body mass index (BMI) is used to identify weight problems. It estimates body fat based on height and weight. Your health care provider can help determine your BMI and help you achieve or maintain a healthy weight. Get regular exercise Get regular exercise. This is one of the most important things you can do for your health. Most adults should:  Exercise for at least 150 minutes each week. The exercise should increase your heart rate and make you sweat (moderate-intensity exercise).  Do strengthening exercises at least twice a week. This is in addition to the moderate-intensity exercise.  Spend less time sitting. Even light physical activity can be beneficial. Watch cholesterol and blood lipids Have your blood tested for lipids and cholesterol at 50 years of age, then have this test every 5 years. Have your cholesterol levels checked more often if:  Your lipid or cholesterol levels are high.  You are older than 50 years of age.  You are at high risk for heart disease. What should I know about cancer screening? Depending on your health history and family history, you may need to have cancer screening at various ages. This may include screening for:  Breast cancer.  Cervical cancer.  Colorectal cancer.  Skin cancer.  Lung cancer. What should I know about heart disease, diabetes, and high blood  pressure? Blood pressure and heart disease  High blood pressure causes heart disease and increases the risk of stroke. This is more likely to develop in people who have high blood pressure readings, are of African descent, or are overweight.  Have your blood pressure checked: ? Every 3-5 years if you are 18-39 years of age. ? Every year if you are 40 years old or older. Diabetes Have regular diabetes screenings. This checks your fasting blood sugar level. Have the screening done:  Once every three years after age 40 if you are at a normal weight and have a low risk for diabetes.  More often and at a younger age if you are overweight or have a high risk for diabetes. What should I know about preventing infection? Hepatitis B If you have a higher risk for hepatitis B, you should be screened for this virus. Talk with your health care provider to find out if you are at risk for hepatitis B infection. Hepatitis C Testing is recommended for:  Everyone born from 1945 through 1965.  Anyone with known risk factors for hepatitis C. Sexually transmitted infections (STIs)  Get screened for STIs, including gonorrhea and chlamydia, if: ? You are sexually active and are younger than 50 years of age. ? You are older than 50 years of age and your health care provider tells you that you are at risk for this type of infection. ? Your sexual activity has changed since you were last screened, and you are at increased risk for chlamydia or gonorrhea. Ask your health care provider if   you are at risk.  Ask your health care provider about whether you are at high risk for HIV. Your health care provider may recommend a prescription medicine to help prevent HIV infection. If you choose to take medicine to prevent HIV, you should first get tested for HIV. You should then be tested every 3 months for as long as you are taking the medicine. Pregnancy  If you are about to stop having your period (premenopausal) and  you may become pregnant, seek counseling before you get pregnant.  Take 400 to 800 micrograms (mcg) of folic acid every day if you become pregnant.  Ask for birth control (contraception) if you want to prevent pregnancy. Osteoporosis and menopause Osteoporosis is a disease in which the bones lose minerals and strength with aging. This can result in bone fractures. If you are 65 years old or older, or if you are at risk for osteoporosis and fractures, ask your health care provider if you should:  Be screened for bone loss.  Take a calcium or vitamin D supplement to lower your risk of fractures.  Be given hormone replacement therapy (HRT) to treat symptoms of menopause. Follow these instructions at home: Lifestyle  Do not use any products that contain nicotine or tobacco, such as cigarettes, e-cigarettes, and chewing tobacco. If you need help quitting, ask your health care provider.  Do not use street drugs.  Do not share needles.  Ask your health care provider for help if you need support or information about quitting drugs. Alcohol use  Do not drink alcohol if: ? Your health care provider tells you not to drink. ? You are pregnant, may be pregnant, or are planning to become pregnant.  If you drink alcohol: ? Limit how much you use to 0-1 drink a day. ? Limit intake if you are breastfeeding.  Be aware of how much alcohol is in your drink. In the [place] one drink equals one 12 oz bottle of beer (355 mL), one 5 oz glass of wine (148 mL), or one 1 oz glass of hard liquor (44 mL). General instructions  Schedule regular health, dental, and eye exams.  Stay current with your vaccines.  Tell your health care provider if: ? You often feel depressed. ? You have ever been abused or do not feel safe at home. Summary  Adopting a healthy lifestyle and getting preventive care are important in promoting health and wellness.  Follow your health care provider's instructions about healthy  diet, exercising, and getting tested or screened for diseases.  Follow your health care provider's instructions on monitoring your cholesterol and blood pressure. This information is not intended to replace advice given to you by your health care provider. Make sure you discuss any questions you have with your health care provider. Document Revised: 09/25/2018 Document Reviewed: 09/25/2018 Elsevier Patient Education  2020 Elsevier Inc.  

## 2020-05-06 DIAGNOSIS — Z0000 Encounter for general adult medical examination without abnormal findings: Secondary | ICD-10-CM | POA: Diagnosis not present

## 2020-05-07 LAB — LIPID PANEL
Cholesterol: 194 mg/dL (ref <200)
HDL: 63 mg/dL (ref >=50)
LDL Cholesterol (Calc): 117 mg/dL (calc) — ABNORMAL HIGH
Non-HDL Cholesterol (Calc): 131 mg/dL (calc) — ABNORMAL HIGH (ref <130)
Total CHOL/HDL Ratio: 3.1 (calc) (ref <5.0)
Triglycerides: 57 mg/dL (ref <150)

## 2020-05-07 LAB — CBC
HCT: 42.7 % (ref 35.0–45.0)
Hemoglobin: 14.3 g/dL (ref 11.7–15.5)
MCH: 31.1 pg (ref 27.0–33.0)
MCHC: 33.5 g/dL (ref 32.0–36.0)
MCV: 92.8 fL (ref 80.0–100.0)
MPV: 11.3 fL (ref 7.5–12.5)
Platelets: 220 10*3/uL (ref 140–400)
RBC: 4.6 10*6/uL (ref 3.80–5.10)
RDW: 11.3 % (ref 11.0–15.0)
WBC: 3.5 10*3/uL — ABNORMAL LOW (ref 3.8–10.8)

## 2020-05-07 LAB — COMPLETE METABOLIC PANEL WITH GFR
AG Ratio: 1.5 (calc) (ref 1.0–2.5)
ALT: 14 U/L (ref 6–29)
AST: 16 U/L (ref 10–35)
Albumin: 4.2 g/dL (ref 3.6–5.1)
Alkaline phosphatase (APISO): 37 U/L (ref 37–153)
BUN: 14 mg/dL (ref 7–25)
CO2: 30 mmol/L (ref 20–32)
Calcium: 9.3 mg/dL (ref 8.6–10.4)
Chloride: 104 mmol/L (ref 98–110)
Creat: 0.76 mg/dL (ref 0.50–1.05)
GFR, Est African American: 106 mL/min/{1.73_m2} (ref >=60)
GFR, Est Non African American: 91 mL/min/{1.73_m2} (ref >=60)
Globulin: 2.8 g/dL (calc) (ref 1.9–3.7)
Glucose, Bld: 94 mg/dL (ref 65–99)
Potassium: 4.3 mmol/L (ref 3.5–5.3)
Sodium: 141 mmol/L (ref 135–146)
Total Bilirubin: 0.6 mg/dL (ref 0.2–1.2)
Total Protein: 7 g/dL (ref 6.1–8.1)

## 2020-05-07 LAB — HEPATITIS C ANTIBODY
Hepatitis C Ab: NONREACTIVE
SIGNAL TO CUT-OFF: 0.01 (ref <1.00)

## 2020-05-10 ENCOUNTER — Encounter: Payer: Self-pay | Admitting: Family Medicine

## 2020-05-10 ENCOUNTER — Ambulatory Visit
Admission: RE | Admit: 2020-05-10 | Discharge: 2020-05-10 | Disposition: A | Discharge status: Home / Self Care | Payer: BC Managed Care – PPO | Source: Ambulatory Visit | Attending: Obstetrics & Gynecology | Admitting: Obstetrics & Gynecology

## 2020-05-10 ENCOUNTER — Other Ambulatory Visit: Payer: Self-pay

## 2020-05-10 DIAGNOSIS — D72819 Decreased white blood cell count, unspecified: Secondary | ICD-10-CM

## 2020-05-10 DIAGNOSIS — Z1231 Encounter for screening mammogram for malignant neoplasm of breast: Secondary | ICD-10-CM | POA: Diagnosis not present

## 2020-05-10 DIAGNOSIS — E7800 Pure hypercholesterolemia, unspecified: Secondary | ICD-10-CM

## 2020-05-11 NOTE — Telephone Encounter (Signed)
Lipid panel and CBC pended. Not exactly sure what diagnosis to use for the CBC recheck.. please advise

## 2020-05-11 NOTE — Telephone Encounter (Signed)
Orders signed.  We can check labs first.  If everything is back to normal then we can just do a routine follow-up but if things are still off then we can always follow-up in person.

## 2020-05-14 ENCOUNTER — Encounter: Payer: Self-pay | Admitting: Family Medicine

## 2020-05-14 DIAGNOSIS — F418 Other specified anxiety disorders: Secondary | ICD-10-CM

## 2020-05-17 ENCOUNTER — Other Ambulatory Visit (HOSPITAL_COMMUNITY)
Admission: RE | Admit: 2020-05-17 | Discharge: 2020-05-17 | Disposition: A | Discharge status: Home / Self Care | Payer: BC Managed Care – PPO | Source: Ambulatory Visit | Attending: Obstetrics & Gynecology | Admitting: Obstetrics & Gynecology

## 2020-05-17 ENCOUNTER — Other Ambulatory Visit: Payer: Self-pay

## 2020-05-17 ENCOUNTER — Encounter: Payer: Self-pay | Admitting: Obstetrics & Gynecology

## 2020-05-17 ENCOUNTER — Ambulatory Visit (INDEPENDENT_AMBULATORY_CARE_PROVIDER_SITE_OTHER): Payer: BC Managed Care – PPO | Admitting: Obstetrics & Gynecology

## 2020-05-17 VITALS — BP 134/80 | HR 73 | Wt 148.0 lb

## 2020-05-17 DIAGNOSIS — F329 Major depressive disorder, single episode, unspecified: Secondary | ICD-10-CM

## 2020-05-17 DIAGNOSIS — Z01419 Encounter for gynecological examination (general) (routine) without abnormal findings: Secondary | ICD-10-CM

## 2020-05-17 DIAGNOSIS — D729 Disorder of white blood cells, unspecified: Secondary | ICD-10-CM | POA: Diagnosis not present

## 2020-05-17 DIAGNOSIS — F418 Other specified anxiety disorders: Secondary | ICD-10-CM

## 2020-05-17 DIAGNOSIS — F32A Depression, unspecified: Secondary | ICD-10-CM

## 2020-05-17 MED ORDER — CITALOPRAM HYDROBROMIDE 20 MG PO TABS
20.0000 mg | ORAL_TABLET | Freq: Every day | ORAL | 2 refills | Status: DC
Start: 2020-05-17 — End: 2020-06-28

## 2020-05-17 NOTE — Progress Notes (Signed)
Subjective:     Ruth Garcia is a 49 y.o. female here for a routine exam.  Current complaints: increased anxiety, especially around health care.  Scores high on anxiety and depression screening.  Patient recently diagnosed with a low white blood cell count.  She is very anxious she has something wrong with her.  Help her to repeat the lab today and add a fibrinogen.  Sees Dr. Serita Grammes for lifetime risk of breast cancer.  She is alternating mammograms and breast MRIs every 6 months.  She is starting to enter the perimenopause.  Sometimes she skips periods.  One time she had a long menses.  Patient is going to continue to keep a menstrual diary which is very helpful.  Gynecologic History No LMP recorded. Contraception: vasectomy Last Pap: 2018. Results were: normal Last mammogram: 2021. Results were: normal  Obstetric History OB History  Gravida Para Term Preterm AB Living  2 2 2     2   SAB TAB Ectopic Multiple Live Births               # Outcome Date GA Lbr Len/2nd Weight Sex Delivery Anes PTL Lv  2 Term           1 Term              The following portions of the patient's history were reviewed and updated as appropriate: allergies, current medications, past family history, past medical history, past social history, past surgical history and problem list.  Review of Systems Pertinent items noted in HPI and remainder of comprehensive ROS otherwise negative.    Objective:      Vitals:   05/17/20 1057  BP: (!) 134/80  Pulse: 73  Weight: 148 lb (67.1 kg)   Vitals:  WNL General appearance: alert, cooperative and no distress  HEENT: Normocephalic, without obvious abnormality, atraumatic Eyes: negative Throat: lips, mucosa, and tongue normal; teeth and gums normal  Respiratory: Clear to auscultation bilaterally  CV: Regular rate and rhythm  Breasts:  Normal appearance, no masses or tenderness, no nipple retraction or dimpling  GI: Soft, non-tender; bowel sounds normal;  no masses,  no organomegaly  GU: External Genitalia:  Tanner V, no lesion Urethra:  No prolapse   Vagina: Pink, normal rugae, no blood or discharge  Cervix: No CMT, no lesion  Uterus:  Normal size and contour, non tender  Adnexa: Normal, no masses, non tender  Musculoskeletal: No edema, redness or tenderness in the calves or thighs  Skin: No lesions or rash  Lymphatic: Axillary adenopathy: none     Psychiatric: Normal mood and behavior        Assessment:    Healthy female exam.   Anxiety and Depression   Plan:    1.   Pap with co testing 2.  Alternate mammograms and MRIs every 6 months and continue to see Dr. Donne Hazel. 3.  Start Celexa for depression and anxiety.  Patient should follow-up with Dr. Charise Carwin in 4 to 6 weeks 4.  Repeat white blood cell count and fibrinogen Ambulatory referral to behavioral health for CBT and counseling.

## 2020-05-17 NOTE — Progress Notes (Signed)
Pt has noticed an increase in anxiety and wants to restart Zoloft  GAD 7: Score 18 PHQ-9: Score 18  Last mammogram- 7/27/2- negative Last pap- 03/21/17- negative

## 2020-05-17 NOTE — Addendum Note (Signed)
Addended by: Lyndal Rainbow on: 05/17/2020 02:54 PM   Modules accepted: Orders

## 2020-05-18 LAB — CBC
HCT: 43.8 % (ref 35.0–45.0)
Hemoglobin: 14.7 g/dL (ref 11.7–15.5)
MCH: 30.9 pg (ref 27.0–33.0)
MCHC: 33.6 g/dL (ref 32.0–36.0)
MCV: 92 fL (ref 80.0–100.0)
MPV: 10.7 fL (ref 7.5–12.5)
Platelets: 271 10*3/uL (ref 140–400)
RBC: 4.76 10*6/uL (ref 3.80–5.10)
RDW: 11.3 % (ref 11.0–15.0)
WBC: 5.3 10*3/uL (ref 3.8–10.8)

## 2020-05-18 LAB — CYTOLOGY - PAP
Comment: NEGATIVE
Diagnosis: NEGATIVE
High risk HPV: NEGATIVE

## 2020-05-18 LAB — FIBRINOGEN: Fibrinogen: 196 mg/dL (ref 175–425)

## 2020-05-18 NOTE — Addendum Note (Signed)
Addended by: Beatrice Lecher D on: 05/18/2020 09:02 PM   Modules accepted: Orders

## 2020-05-18 NOTE — Telephone Encounter (Signed)
[name], can you check to see how far out [name] is booked?

## 2020-05-19 ENCOUNTER — Encounter: Payer: Self-pay | Admitting: Gastroenterology

## 2020-05-19 NOTE — Telephone Encounter (Signed)
When looking referrals a new one was placed for [facility] and they have already tried to call patient once to schedule - CF

## 2020-06-01 ENCOUNTER — Ambulatory Visit (INDEPENDENT_AMBULATORY_CARE_PROVIDER_SITE_OTHER): Payer: BC Managed Care – PPO | Admitting: Physician Assistant

## 2020-06-01 ENCOUNTER — Other Ambulatory Visit: Payer: Self-pay

## 2020-06-01 ENCOUNTER — Encounter: Payer: Self-pay | Admitting: Physician Assistant

## 2020-06-01 DIAGNOSIS — L821 Other seborrheic keratosis: Secondary | ICD-10-CM

## 2020-06-01 DIAGNOSIS — L578 Other skin changes due to chronic exposure to nonionizing radiation: Secondary | ICD-10-CM | POA: Diagnosis not present

## 2020-06-01 DIAGNOSIS — Z1283 Encounter for screening for malignant neoplasm of skin: Secondary | ICD-10-CM | POA: Diagnosis not present

## 2020-06-01 DIAGNOSIS — L814 Other melanin hyperpigmentation: Secondary | ICD-10-CM

## 2020-06-01 DIAGNOSIS — B078 Other viral warts: Secondary | ICD-10-CM | POA: Diagnosis not present

## 2020-06-01 NOTE — Progress Notes (Signed)
   Follow-Up Visit   Subjective  Ruth Garcia is a 49 y.o. female who presents for the following: Follow-up (skin check--1 yr,).   The following portions of the chart were reviewed this encounter and updated as appropriate:     Objective  Well appearing patient in no apparent distress; mood and affect are within normal limits.  A full examination was performed including scalp, head, eyes, ears, nose, lips, neck, chest, axillae, abdomen, back, buttocks, bilateral upper extremities, bilateral lower extremities, hands, feet, fingers, toes, fingernails, and toenails. All findings within normal limits unless otherwise noted below.  Objective  Head - to toe: No atypical nevi No signs of non-mole skin cancer.   Objective  Right Elbow - Posterior, Right Forearm - Posterior (5): Verrucous papules -- Discussed viral etiology and contagion.    Assessment & Plan  Screening exam for skin cancer Head - to toe  Yearly exams  Common wart (6) Right Elbow - Posterior; Right Forearm - Posterior (5)  Destruction of lesion - Right Elbow - Posterior, Right Forearm - Posterior  Destruction method: cryotherapy   Informed consent: discussed and consent obtained   Timeout:  patient name, date of birth, surgical site, and procedure verified Lesion destroyed using liquid nitrogen: Yes   Cryotherapy cycles:  1 Outcome: patient tolerated procedure well with no complications   Post-procedure details: wound care instructions given     Lentigines - Scattered tan macules - Discussed due to sun exposure - Benign, observe - Call for any changes  Seborrheic Keratoses - Stuck-on, waxy, tan-brown papules and plaques  - Discussed benign etiology and prognosis. - Observe - Call for any changes   Actinic Damage - diffuse scaly erythematous macules with underlying dyspigmentation - Recommend daily broad spectrum sunscreen SPF 30+ to sun-exposed areas, reapply every 2 hours as needed.  - Call for  new or changing lesions.  Skin cancer screening performed today.  I, Vishwa Dais, PA-C, have reviewed all documentation's for this visit.  The documentation on 06/01/20 for the exam, diagnosis, procedures and orders are all accurate and complete.

## 2020-06-28 ENCOUNTER — Encounter: Payer: Self-pay | Admitting: Family Medicine

## 2020-06-28 ENCOUNTER — Ambulatory Visit (INDEPENDENT_AMBULATORY_CARE_PROVIDER_SITE_OTHER): Payer: BC Managed Care – PPO | Admitting: Family Medicine

## 2020-06-28 ENCOUNTER — Other Ambulatory Visit: Payer: Self-pay

## 2020-06-28 DIAGNOSIS — F418 Other specified anxiety disorders: Secondary | ICD-10-CM

## 2020-06-28 MED ORDER — CITALOPRAM HYDROBROMIDE 20 MG PO TABS: 20.0000 mg | ORAL_TABLET | Freq: Every day | ORAL | 0 refills | Status: DC

## 2020-06-28 NOTE — Assessment & Plan Note (Signed)
Overall she is doing well.  She is happy with her current regimen.  PHQ-9 score went from 18 down to 6.  And GAD-7 score went from 18 down to 7.  She wanted to know if there was any benefit to going back on the Zoloft which is what she had been previously taking she just eventually came off of it because she was doing well, versus continue with the citalopram.  She does feel like her sleep quality has been a little bit better.  She has not felt nearly as anxious she is not had any negative effects from the medication thus far.  1 refill for 90 days and then I will see her back in about 10 weeks so that we can make sure that she is happy with her regimen.  If at any point she would prefer to go back to sertraline she can always let me know and we can send in a new prescription.

## 2020-06-28 NOTE — Progress Notes (Signed)
Established Patient Office Visit  Subjective:  Patient ID: Ruth Garcia, female    DOB: 10/13/1971  Age: 49 y.o. MRN: [id number]  CC: No chief complaint on file.   HPI Ruth Garcia presents for follow-up depression and anxiety-she recently saw Dr. Gala Romney for her well woman exam on August 2.  After schedule seeing some of her fears that there was something wrong with her doctor like it decided to start her on Celexa and encouraged her to follow-up with me at the 6-week mark.  Her PHQ-9 score was 18 and her GAD-7 score was 18 as well.  She has been tolerating the medication well so far.  He has been scheduled with Juliann Pulse for counseling and has her first appointment coming up soon.  She wondered if she should keep that.  History reviewed. No pertinent past medical history.  Past Surgical History:  Procedure Laterality Date  . BREAST BIOPSY Bilateral 04/2017   benign  . URETHRAL CYST REMOVAL      Family History  Problem Relation Age of Onset  . Thyroid disease Mother   . Thyroid disease Daughter   . Cancer Maternal Aunt        2 with breast and one with ovarian  . Breast cancer Maternal Aunt        x2  . Cancer Maternal Uncle        colon  . Breast cancer Sister     Social History   Socioeconomic History  . Marital status: Married    Spouse name: Not on file  . Number of children: Not on file  . Years of education: Not on file  . Highest education level: Not on file  Occupational History  . Occupation: Pharmacist, hospital   Tobacco Use  . Smoking status: Never Smoker  . Smokeless tobacco: Never Used  Substance and Sexual Activity  . Alcohol use: Yes    Alcohol/week: 1.0 standard drink    Types: 1 Glasses of wine per week    Comment: WEEKLY  . Drug use: No  . Sexual activity: Yes    Partners: Male    Birth control/protection: Surgical    Comment: husband had vasectomy  Other Topics Concern  . Not on file  Social History Narrative   Oncologist at [employer].   Social Determinants of Health   Financial Resource Strain:   . Difficulty of Paying Living Expenses: Not on file  Food Insecurity:   . Worried About Charity fundraiser in the Last Year: Not on file  . Ran Out of Food in the Last Year: Not on file  Transportation Needs:   . Lack of Transportation (Medical): Not on file  . Lack of Transportation (Non-Medical): Not on file  Physical Activity:   . Days of Exercise per Week: Not on file  . Minutes of Exercise per Session: Not on file  Stress:   . Feeling of Stress : Not on file  Social Connections:   . Frequency of Communication with Friends and Family: Not on file  . Frequency of Social Gatherings with Friends and Family: Not on file  . Attends Religious Services: Not on file  . Active Member of Clubs or Organizations: Not on file  . Attends Archivist Meetings: Not on file  . Marital Status: Not on file  Intimate Partner Violence:   . Fear of Current or Ex-Partner: Not on file  . Emotionally Abused: Not on file  . Physically Abused:  Not on file  . Sexually Abused: Not on file    Outpatient Medications Prior to Visit  Medication Sig Dispense Refill  . sertraline (ZOLOFT) 50 MG tablet Take 75 mg by mouth daily.    . Calcium Carbonate-Vitamin D (CALCIUM + D PO) Take 1 tablet by mouth daily.     . Cholecalciferol 2000 units CAPS Take 1 capsule by mouth daily.     . fish oil-omega-3 fatty acids 1000 MG capsule Take 2 g by mouth daily.    . Multiple Vitamin (MULTIVITAMIN) capsule Take 1 capsule by mouth daily.    . Selenium 200 MCG CAPS Take by mouth.     . vitamin C (ASCORBIC ACID) 500 MG tablet Take 500 mg by mouth daily.    . vitamin E 400 UNIT capsule Take 400 Units by mouth daily.    . citalopram (CELEXA) 20 MG tablet Take 1 tablet (20 mg total) by mouth daily. 30 tablet 2   No facility-administered medications prior to visit.    No Known Allergies  ROS Review of Systems    Objective:     Physical Exam Constitutional:      Appearance: She is well-developed.  HENT:     Head: Normocephalic and atraumatic.  Pulmonary:     Effort: Pulmonary effort is normal.  Skin:    General: Skin is warm and dry.  Neurological:     Mental Status: She is alert and oriented to person, place, and time.  Psychiatric:        Behavior: Behavior normal.     BP (!) 115/52   Pulse 64   Ht 5\' 6"  (1.676 m)   Wt 148 lb (67.1 kg)   SpO2 100%   BMI 23.89 kg/m  Wt Readings from Last 3 Encounters:  06/28/20 148 lb (67.1 kg)  05/17/20 148 lb (67.1 kg)  05/05/20 153 lb (69.4 kg)     There are no preventive care reminders to display for this patient.  There are no preventive care reminders to display for this patient.  Lab Results  Component Value Date   TSH 3.27 05/06/2019   Lab Results  Component Value Date   WBC 5.3 05/17/2020   HGB 14.7 05/17/2020   HCT 43.8 05/17/2020   MCV 92.0 05/17/2020   PLT 271 05/17/2020   Lab Results  Component Value Date   NA 141 05/06/2020   K 4.3 05/06/2020   CO2 30 05/06/2020   GLUCOSE 94 05/06/2020   BUN 14 05/06/2020   CREATININE 0.76 05/06/2020   BILITOT 0.6 05/06/2020   ALKPHOS 42 05/23/2017   AST 16 05/06/2020   ALT 14 05/06/2020   PROT 7.0 05/06/2020   ALBUMIN 4.1 05/23/2017   CALCIUM 9.3 05/06/2020   Lab Results  Component Value Date   CHOL 194 05/06/2020   Lab Results  Component Value Date   HDL 63 05/06/2020   Lab Results  Component Value Date   LDLCALC 117 (H) 05/06/2020   Lab Results  Component Value Date   TRIG 57 05/06/2020   Lab Results  Component Value Date   CHOLHDL 3.1 05/06/2020   No results found for: HGBA1C    Assessment & Plan:   Problem List Items Addressed This Visit      Other   Depression with anxiety    Overall she is doing well.  She is happy with her current regimen.  PHQ-9 score went from 18 down to 6.  And GAD-7 score went from 18  down to 7.  She wanted to know if there was any  benefit to going back on the Zoloft which is what she had been previously taking she just eventually came off of it because she was doing well, versus continue with the citalopram.  She does feel like her sleep quality has been a little bit better.  She has not felt nearly as anxious she is not had any negative effects from the medication thus far.  1 refill for 90 days and then I will see her back in about 10 weeks so that we can make sure that she is happy with her regimen.  If at any point she would prefer to go back to sertraline she can always let me know and we can send in a new prescription.      Relevant Medications   citalopram (CELEXA) 20 MG tablet     Encouraged her to keep appointment with Juliann Pulse for therapy/counseling I think this could be really helpful for her she is at a lot of transitions going on in life right now.  Meds ordered this encounter  Medications  . citalopram (CELEXA) 20 MG tablet    Sig: Take 1 tablet (20 mg total) by mouth daily.    Dispense:  90 tablet    Refill:  0    Follow-up: Return in about 10 weeks (around 09/06/2020) for follow up mood medication.     Beatrice Lecher, MD

## 2020-07-12 ENCOUNTER — Ambulatory Visit: Payer: BC Managed Care – PPO | Admitting: Professional

## 2020-07-14 ENCOUNTER — Ambulatory Visit (HOSPITAL_COMMUNITY): Payer: Self-pay | Admitting: Licensed Clinical Social Worker

## 2020-07-16 ENCOUNTER — Other Ambulatory Visit: Payer: Self-pay

## 2020-07-16 ENCOUNTER — Ambulatory Visit (AMBULATORY_SURGERY_CENTER): Payer: Self-pay | Admitting: *Deleted

## 2020-07-16 VITALS — Ht 66.0 in | Wt 152.0 lb

## 2020-07-16 DIAGNOSIS — Z1211 Encounter for screening for malignant neoplasm of colon: Secondary | ICD-10-CM

## 2020-07-16 NOTE — Progress Notes (Signed)

## 2020-07-20 ENCOUNTER — Encounter: Payer: Self-pay | Admitting: Gastroenterology

## 2020-07-30 ENCOUNTER — Encounter: Payer: BC Managed Care – PPO | Admitting: Gastroenterology

## 2020-08-03 ENCOUNTER — Ambulatory Visit (AMBULATORY_SURGERY_CENTER): Payer: BC Managed Care – PPO | Admitting: Gastroenterology

## 2020-08-03 ENCOUNTER — Other Ambulatory Visit: Payer: Self-pay

## 2020-08-03 ENCOUNTER — Encounter: Payer: Self-pay | Admitting: Gastroenterology

## 2020-08-03 VITALS — BP 94/49 | HR 56 | Temp 96.6°F | Resp 14 | Ht 66.0 in | Wt 152.0 lb

## 2020-08-03 DIAGNOSIS — Z1211 Encounter for screening for malignant neoplasm of colon: Secondary | ICD-10-CM

## 2020-08-03 MED ORDER — SODIUM CHLORIDE 0.9 % IV SOLN: 500.0000 mL | Freq: Once | INTRAVENOUS | Status: DC

## 2020-08-03 NOTE — Op Note (Signed)
Ruth Garcia: Ruth Garcia Procedure Date: 08/03/2020 11:16 AM MRN: [id number] Endoscopist: Milus Banister , MD Age: 49 Referring MD:  Date of Birth: [birth year] Gender: Female Account #: [id number] Procedure:                Colonoscopy Indications:              Screening for colorectal malignant neoplasm Medicines:                Monitored Anesthesia Care Procedure:                Pre-Anesthesia Assessment:                           - Prior to the procedure, a History and Physical                            was performed, and patient medications and                            allergies were reviewed. The patient's tolerance of                            previous anesthesia was also reviewed. The risks                            and benefits of the procedure and the sedation                            options and risks were discussed with the patient.                            All questions were answered, and informed consent                            was obtained. Prior Anticoagulants: The patient has                            taken no previous anticoagulant or antiplatelet                            agents. ASA Grade Assessment: II - A patient with                            mild systemic disease. After reviewing the risks                            and benefits, the patient was deemed in                            satisfactory condition to undergo the procedure.                           After obtaining informed consent, the colonoscope  was passed under direct vision. Throughout the                            procedure, the patient's blood pressure, pulse, and                            oxygen saturations were monitored continuously. The                            Colonoscope was introduced through the anus and                            advanced to the the cecum, identified by                            appendiceal orifice and  ileocecal valve. The                            colonoscopy was performed without difficulty. The                            patient tolerated the procedure well. The quality                            of the bowel preparation was good. The ileocecal                            valve, appendiceal orifice, and rectum were                            photographed. Scope In: 11:22:56 AM Scope Out: 11:37:29 AM Scope Withdrawal Time: 0 hours 9 minutes 40 seconds  Total Procedure Duration: 0 hours 14 minutes 33 seconds  Findings:                 The entire examined colon appeared normal on direct                            and retroflexion views. Complications:            No immediate complications. Estimated blood loss:                            None. Estimated Blood Loss:     Estimated blood loss: none. Impression:               - The entire examined colon is normal on direct and                            retroflexion views.                           - No polyps or cancers. Recommendation:           - Patient has a contact number available for  emergencies. The signs and symptoms of potential                            delayed complications were discussed with the                            patient. Return to normal activities tomorrow.                            Written discharge instructions were provided to the                            patient.                           - Resume previous diet.                           - Continue present medications.                           - Repeat colonoscopy in 10 years for screening. Milus Banister, MD 08/03/2020 11:39:02 AM This report has been signed electronically.

## 2020-08-03 NOTE — Progress Notes (Signed)
Vital signs checked by:SP  The patient states no changes in medical or surgical history since pre-visit screening on 07/16/2020.

## 2020-08-03 NOTE — Progress Notes (Signed)
Report to PACU, RN, vss, BBS= Clear.  

## 2020-08-03 NOTE — Patient Instructions (Signed)
YOU HAD AN ENDOSCOPIC PROCEDURE TODAY AT THE [facility]:   Refer to the procedure report that was given to you for any specific questions about what was found during the examination.  If the procedure report does not answer your questions, please call your gastroenterologist to clarify.  If you requested that your care partner not be given the details of your procedure findings, then the procedure report has been included in a sealed envelope for you to review at your convenience later. ° °YOU SHOULD EXPECT: Some feelings of bloating in the abdomen. Passage of more gas than usual.  Walking can help get rid of the air that was put into your GI tract during the procedure and reduce the bloating. If you had a lower endoscopy (such as a colonoscopy or flexible sigmoidoscopy) you may notice spotting of blood in your stool or on the toilet paper. If you underwent a bowel prep for your procedure, you may not have a normal bowel movement for a few days. ° °Please Note:  You might notice some irritation and congestion in your nose or some drainage.  This is from the oxygen used during your procedure.  There is no need for concern and it should clear up in a day or so. ° °SYMPTOMS TO REPORT IMMEDIATELY: ° °Following lower endoscopy (colonoscopy or flexible sigmoidoscopy): ° Excessive amounts of blood in the stool ° Significant tenderness or worsening of abdominal pains ° Swelling of the abdomen that is new, acute ° Fever of 100°F or higher ° °For urgent or emergent issues, a gastroenterologist can be reached at any hour by calling [phone]. °Do not use MyChart messaging for urgent concerns.  ° ° °DIET:  We do recommend a small meal at first, but then you may proceed to your regular diet.  Drink plenty of fluids but you should avoid alcoholic beverages for 24 hours. ° °ACTIVITY:  You should plan to take it easy for the rest of today and you should NOT DRIVE or use heavy machinery until tomorrow (because of  the sedation medicines used during the test).   ° °FOLLOW UP: °Our staff will call the number listed on your records 48-72 hours following your procedure to check on you and address any questions or concerns that you may have regarding the information given to you following your procedure. If we do not reach you, we will leave a message.  We will attempt to reach you two times.  During this call, we will ask if you have developed any symptoms of COVID 19. If you develop any symptoms (ie: fever, flu-like symptoms, shortness of breath, cough etc.) before then, please call [phone].  If you test positive for Covid 19 in the 2 weeks post procedure, please call and report this information to us.   ° °SIGNATURES/CONFIDENTIALITY: °You and/or your care partner have signed paperwork which will be entered into your electronic medical record.  These signatures attest to the fact that that the information above on your After Visit Summary has been reviewed and is understood.  Full responsibility of the confidentiality of this discharge information lies with you and/or your care-partner.  °

## 2020-08-05 ENCOUNTER — Telehealth: Payer: Self-pay | Admitting: *Deleted

## 2020-08-05 NOTE — Telephone Encounter (Signed)
  Follow up Call-  Call back number 08/03/2020  Post procedure Call Back phone  # [phone]  Permission to leave phone message Yes  Some recent data might be hidden     First attempt for follow up phone call. No answer at number given.  Left message on voicemail.

## 2020-08-05 NOTE — Telephone Encounter (Signed)
Second f/u call attempt.  No answer or vm option.

## 2020-08-07 ENCOUNTER — Other Ambulatory Visit: Payer: Self-pay

## 2020-08-07 ENCOUNTER — Emergency Department
Admission: RE | Admit: 2020-08-07 | Discharge: 2020-08-07 | Disposition: A | Discharge status: Home / Self Care | Payer: BC Managed Care – PPO | Source: Ambulatory Visit

## 2020-08-07 VITALS — BP 116/73 | HR 69 | Temp 98.5°F | Ht 66.0 in | Wt 150.0 lb

## 2020-08-07 DIAGNOSIS — R21 Rash and other nonspecific skin eruption: Secondary | ICD-10-CM | POA: Diagnosis not present

## 2020-08-07 DIAGNOSIS — L309 Dermatitis, unspecified: Secondary | ICD-10-CM

## 2020-08-07 MED ORDER — TRIAMCINOLONE ACETONIDE 0.1 % EX CREA: 1.0000 "application " | TOPICAL_CREAM | Freq: Two times a day (BID) | CUTANEOUS | 0 refills | Status: DC

## 2020-08-07 NOTE — ED Provider Notes (Signed)
Ruth Garcia   MRN: [id number] DOB: 10-Jun-1971  Subjective:   Ruth Garcia is a 49 y.o. female presenting for 6-week history of persistent rash left axilla, now also seen on the right axilla.  Patient states that generally it does not bother her except for some mild itching of the left side.  Symptoms started after she wore a new type of stroke but given that the rash persisted she wanted to be evaluated.  She has tried a Pharmacist, community.  Denies any other symptoms.  Denies tenderness, drainage, swelling, fatigue, history of skin issues.  She does admit that she has previously chafed quite easily in these areas but has resolved without much intervention.  Denies using any new creams, deodorants, detergents, general exposures.  No current facility-administered medications for this encounter.  Current Outpatient Medications:  .  Calcium Carbonate-Vitamin D (CALCIUM + D PO), Take 1 tablet by mouth daily. Ultra Calcium 1200 mg daily, Disp: , Rfl:  .  Cholecalciferol 2000 units CAPS, Take 1 capsule by mouth daily. , Disp: , Rfl:  .  citalopram (CELEXA) 20 MG tablet, Take 1 tablet (20 mg total) by mouth daily., Disp: 90 tablet, Rfl: 0 .  fish oil-omega-3 fatty acids 1000 MG capsule, Take 2 g by mouth daily. Omega Woman %00 mg Omega 3 76 mg GLA  Daily, Disp: , Rfl:  .  OVER THE COUNTER MEDICATION, CArdioLDL 2 capsules daily, Disp: , Rfl:  .  OVER THE COUNTER MEDICATION, Red Reishi Mushroom Powder 1/3 tsp daily, Disp: , Rfl:  .  OVER THE COUNTER MEDICATION, Lion's Mane Mushroom Powder 1/3 tsp daily, Disp: , Rfl:  .  OVER THE COUNTER MEDICATION, Vital Proteins Collagen Peptides 1 scoop daily, Disp: , Rfl:  .  OVER THE COUNTER MEDICATION, Vitafusions Womens Multi 2 gummies daily, Disp: , Rfl:  .  PRESCRIPTION MEDICATION, MycoMune 4x 1 cap daily, Disp: , Rfl:  .  Selenium 200 MCG CAPS, Take by mouth. , Disp: , Rfl:  .  TURMERIC CURCUMIN PO, Take by mouth. 1000 mg daily, Disp:  , Rfl:  .  vitamin C (ASCORBIC ACID) 500 MG tablet, Take 500 mg by mouth daily., Disp: , Rfl:  .  triamcinolone cream (KENALOG) 0.1 %, Apply 1 application topically 2 (two) times daily., Disp: 30 g, Rfl: 0 .  vitamin E 400 UNIT capsule, Take 400 Units by mouth daily., Disp: , Rfl:    No Known Allergies  Past Medical History:  Diagnosis Date  . Allergy   . Anxiety   . Depression      Past Surgical History:  Procedure Laterality Date  . BREAST BIOPSY Bilateral 04/2017   benign  . dental implants    . URETHRAL CYST REMOVAL    . WISDOM TOOTH EXTRACTION      Family History  Problem Relation Age of Onset  . Thyroid disease Mother   . Thyroid disease Daughter   . Cancer Maternal Aunt        2 with breast and one with ovarian  . Breast cancer Maternal Aunt        x2  . Cancer Maternal Uncle        colon  . Colon cancer Maternal Uncle   . Breast cancer Sister   . Esophageal cancer Paternal Grandfather   . Colon polyps Neg Hx   . Rectal cancer Neg Hx   . Stomach cancer Neg Hx     Social History   Tobacco Use  .  Smoking status: Never Smoker  . Smokeless tobacco: Never Used  Vaping Use  . Vaping Use: Never used  Substance Use Topics  . Alcohol use: Not Currently    Alcohol/week: 1.0 standard drink    Types: 1 Glasses of wine per week  . Drug use: No    ROS   Objective:   Vitals: BP 116/73 (BP Location: Right Arm)   Pulse 69   Temp 98.5 F (36.9 C) (Tympanic)   Ht 5\' 6"  (1.676 m)   Wt 150 lb (68 kg)   LMP 07/12/2020 Comment: light- irregular no normal period since june   SpO2 98%   BMI 24.21 kg/m   Physical Exam Constitutional:      General: She is not in acute distress.    Appearance: Normal appearance. She is well-developed. She is not ill-appearing, toxic-appearing or diaphoretic.  HENT:     Head: Normocephalic and atraumatic.     Nose: Nose normal.     Mouth/Throat:     Mouth: Mucous membranes are moist.     Pharynx: Oropharynx is clear.  Eyes:      General: No scleral icterus.       Right eye: No discharge.        Left eye: No discharge.     Extraocular Movements: Extraocular movements intact.     Conjunctiva/sclera: Conjunctivae normal.     Pupils: Pupils are equal, round, and reactive to light.  Cardiovascular:     Rate and Rhythm: Normal rate.  Pulmonary:     Effort: Pulmonary effort is normal.  Skin:    General: Skin is warm and dry.       Neurological:     General: No focal deficit present.     Mental Status: She is alert and oriented to person, place, and time.  Psychiatric:        Mood and Affect: Mood normal.        Behavior: Behavior normal.        Thought Content: Thought content normal.        Judgment: Judgment normal.         Assessment and Plan :   PDMP not reviewed this encounter.  1. Rash and nonspecific skin eruption   2. Dermatitis     We will manage for a dermatitis of unknown etiology with Kenalog cream.  Counseled on possibility of an atypical fungal infection but we will trial the steroid cream first.  If this does not resolve her rash, recommended recheck and consideration for an antifungal such as ketoconazole cream. Counseled patient on potential for adverse effects with medications prescribed/recommended today, ER and return-to-clinic precautions discussed, patient verbalized understanding.    Jaynee Eagles, [place] 08/07/20 [phone]

## 2020-08-07 NOTE — ED Triage Notes (Signed)
Pt states that she has a rash under her left arm but she is now developing one under her right arm as well. She has used some over the counter creams but they have not helped. x6 weeks

## 2020-08-11 ENCOUNTER — Other Ambulatory Visit: Payer: Self-pay | Admitting: Family Medicine

## 2020-08-11 DIAGNOSIS — F418 Other specified anxiety disorders: Secondary | ICD-10-CM

## 2020-09-03 ENCOUNTER — Telehealth: Payer: Self-pay | Admitting: *Deleted

## 2020-09-03 DIAGNOSIS — Z803 Family history of malignant neoplasm of breast: Secondary | ICD-10-CM

## 2020-09-03 NOTE — Telephone Encounter (Signed)
Order placed for yearly MRI of breast w/wo contrast per Dr Gala Romney.

## 2020-09-08 ENCOUNTER — Ambulatory Visit (INDEPENDENT_AMBULATORY_CARE_PROVIDER_SITE_OTHER): Payer: BC Managed Care – PPO | Admitting: Family Medicine

## 2020-09-08 ENCOUNTER — Encounter: Payer: Self-pay | Admitting: Family Medicine

## 2020-09-08 ENCOUNTER — Other Ambulatory Visit: Payer: Self-pay

## 2020-09-08 VITALS — BP 121/64 | HR 60 | Ht 66.0 in | Wt 142.0 lb

## 2020-09-08 DIAGNOSIS — Z803 Family history of malignant neoplasm of breast: Secondary | ICD-10-CM

## 2020-09-08 DIAGNOSIS — F418 Other specified anxiety disorders: Secondary | ICD-10-CM | POA: Diagnosis not present

## 2020-09-08 MED ORDER — ALPRAZOLAM 0.5 MG PO TABS: 0.5000 mg | ORAL_TABLET | Freq: Once | ORAL | 0 refills | Status: DC | PRN

## 2020-09-08 MED ORDER — CITALOPRAM HYDROBROMIDE 20 MG PO TABS
20.0000 mg | ORAL_TABLET | Freq: Every day | ORAL | 1 refills | Status: DC
Start: 2020-09-15 — End: 2021-04-05

## 2020-09-08 NOTE — Progress Notes (Signed)
Established Patient Office Visit  Subjective:  Patient ID: Ruth Garcia, female    DOB: 03-09-1971  Age: 49 y.o. MRN: [id number]  CC:  Chief Complaint  Patient presents with  . mood    HPI Ruth Garcia presents for Mood.  Follow-up depression/anxiety-overall she is doing well on her medication she is happy with her current regimen still struggles a little bit with some of the anxiety symptoms but feels like overall they are under control.  She does have her breast MRI coming up.  Dr. Gala Romney has ordered it for her she does usually take alprazolam before her appointment as she is a little bit claustrophobic.  Asked if I could send over Xanax today she usually uses 0.5 mg and takes 1 about an hour before the procedure and then 1 when she gets there.  She also has been working with a nutritionist on losing weight and really focusing in on eating a balanced and healthy diet she is actually lost 10 pounds.  She is also been exercising regularly.  Past Medical History:  Diagnosis Date  . Allergy   . Anxiety   . Depression     Past Surgical History:  Procedure Laterality Date  . BREAST BIOPSY Bilateral 04/2017   benign  . dental implants    . URETHRAL CYST REMOVAL    . WISDOM TOOTH EXTRACTION      Family History  Problem Relation Age of Onset  . Thyroid disease Mother   . Thyroid disease Daughter   . Cancer Maternal Aunt        2 with breast and one with ovarian  . Breast cancer Maternal Aunt        x2  . Cancer Maternal Uncle        colon  . Colon cancer Maternal Uncle   . Breast cancer Sister   . Esophageal cancer Paternal Grandfather   . Colon polyps Neg Hx   . Rectal cancer Neg Hx   . Stomach cancer Neg Hx     Social History   Socioeconomic History  . Marital status: Married    Spouse name: Not on file  . Number of children: Not on file  . Years of education: Not on file  . Highest education level: Not on file  Occupational History  . Occupation:  Pharmacist, hospital   Tobacco Use  . Smoking status: Never Smoker  . Smokeless tobacco: Never Used  Vaping Use  . Vaping Use: Never used  Substance and Sexual Activity  . Alcohol use: Not Currently    Alcohol/week: 1.0 standard drink    Types: 1 Glasses of wine per week  . Drug use: No  . Sexual activity: Yes    Partners: Male    Birth control/protection: Surgical    Comment: husband had vasectomy  Other Topics Concern  . Not on file  Social History Narrative   Oncologist at [employer].   Social Determinants of Health   Financial Resource Strain:   . Difficulty of Paying Living Expenses: Not on file  Food Insecurity:   . Worried About Charity fundraiser in the Last Year: Not on file  . Ran Out of Food in the Last Year: Not on file  Transportation Needs:   . Lack of Transportation (Medical): Not on file  . Lack of Transportation (Non-Medical): Not on file  Physical Activity:   . Days of Exercise per Week: Not on file  . Minutes of Exercise per  Session: Not on file  Stress:   . Feeling of Stress : Not on file  Social Connections:   . Frequency of Communication with Friends and Family: Not on file  . Frequency of Social Gatherings with Friends and Family: Not on file  . Attends Religious Services: Not on file  . Active Member of Clubs or Organizations: Not on file  . Attends Archivist Meetings: Not on file  . Marital Status: Not on file  Intimate Partner Violence:   . Fear of Current or Ex-Partner: Not on file  . Emotionally Abused: Not on file  . Physically Abused: Not on file  . Sexually Abused: Not on file    Outpatient Medications Prior to Visit  Medication Sig Dispense Refill  . Calcium Carbonate-Vitamin D (CALCIUM + D PO) Take 1 tablet by mouth daily. Ultra Calcium 1200 mg daily    . Cholecalciferol 2000 units CAPS Take 1 capsule by mouth daily.     . fish oil-omega-3 fatty acids 1000 MG capsule Take 2 g by mouth daily. Omega Woman %00 mg Omega 3  76 mg GLA  Daily    . OVER THE COUNTER MEDICATION CArdioLDL 2 capsules daily    . OVER THE COUNTER MEDICATION Red Reishi Mushroom Powder 1/3 tsp daily    . OVER THE COUNTER MEDICATION Lion's Mane Mushroom Powder 1/3 tsp daily    . OVER THE COUNTER MEDICATION Vital Proteins Collagen Peptides 1 scoop daily    . OVER THE COUNTER MEDICATION Vitafusions Womens Multi 2 gummies daily    . OVER THE COUNTER MEDICATION Take 1 capsule by mouth daily. MycoMune 4x 1 cap daily    . Selenium 200 MCG CAPS Take by mouth.     . TURMERIC CURCUMIN PO Take by mouth. 1000 mg daily    . vitamin C (ASCORBIC ACID) 500 MG tablet Take 500 mg by mouth daily.    . vitamin E 400 UNIT capsule Take 400 Units by mouth daily.    . citalopram (CELEXA) 20 MG tablet Take 1 tablet (20 mg total) by mouth daily. 90 tablet 0  . PRESCRIPTION MEDICATION MycoMune 4x 1 cap daily    . triamcinolone cream (KENALOG) 0.1 % Apply 1 application topically 2 (two) times daily. 30 g 0   No facility-administered medications prior to visit.    No Known Allergies  ROS Review of Systems    Objective:    Physical Exam Constitutional:      Appearance: She is well-developed.  HENT:     Head: Normocephalic and atraumatic.  Cardiovascular:     Rate and Rhythm: Normal rate and regular rhythm.     Heart sounds: Normal heart sounds.  Pulmonary:     Effort: Pulmonary effort is normal.     Breath sounds: Normal breath sounds.  Skin:    General: Skin is warm and dry.  Neurological:     Mental Status: She is alert and oriented to person, place, and time.  Psychiatric:        Behavior: Behavior normal.     BP 121/64   Pulse 60   Ht 5\' 6"  (1.676 m)   Wt 142 lb (64.4 kg)   SpO2 99%   BMI 22.92 kg/m  Wt Readings from Last 3 Encounters:  09/08/20 142 lb (64.4 kg)  08/07/20 150 lb (68 kg)  08/03/20 152 lb (68.9 kg)     There are no preventive care reminders to display for this patient.  There are no  preventive care reminders to  display for this patient.  Lab Results  Component Value Date   TSH 3.27 05/06/2019   Lab Results  Component Value Date   WBC 5.3 05/17/2020   HGB 14.7 05/17/2020   HCT 43.8 05/17/2020   MCV 92.0 05/17/2020   PLT 271 05/17/2020   Lab Results  Component Value Date   NA 141 05/06/2020   K 4.3 05/06/2020   CO2 30 05/06/2020   GLUCOSE 94 05/06/2020   BUN 14 05/06/2020   CREATININE 0.76 05/06/2020   BILITOT 0.6 05/06/2020   ALKPHOS 42 05/23/2017   AST 16 05/06/2020   ALT 14 05/06/2020   PROT 7.0 05/06/2020   ALBUMIN 4.1 05/23/2017   CALCIUM 9.3 05/06/2020   Lab Results  Component Value Date   CHOL 194 05/06/2020   Lab Results  Component Value Date   HDL 63 05/06/2020   Lab Results  Component Value Date   LDLCALC 117 (H) 05/06/2020   Lab Results  Component Value Date   TRIG 57 05/06/2020   Lab Results  Component Value Date   CHOLHDL 3.1 05/06/2020   No results found for: HGBA1C    Assessment & Plan:   Problem List Items Addressed This Visit      Other   Family history of breast cancer    Gets annual mammo and MRI.  Upcoming MRI scheduled.  Xanax sent to pharmacy.      Depression with anxiety - Primary    Continue current regimen with citalopram.  We will make sure that she is got refill sent to the pharmacy for next month.  Follow-up in 6 months.  Please reach out if any problems concerns.      Relevant Medications   ALPRAZolam (XANAX) 0.5 MG tablet   citalopram (CELEXA) 20 MG tablet (Start on 09/15/2020)      In regards to procedure coming up I did send over 2 tabs of alprazolam to use before the MRI.  Meds ordered this encounter  Medications  . ALPRAZolam (XANAX) 0.5 MG tablet    Sig: Take 1 tablet (0.5 mg total) by mouth once as needed for up to 1 dose for anxiety (procedure).    Dispense:  2 tablet    Refill:  0  . citalopram (CELEXA) 20 MG tablet    Sig: Take 1 tablet (20 mg total) by mouth daily.    Dispense:  90 tablet    Refill:  1     Pt will call when needed.    Follow-up: Return in about 6 months (around 03/08/2021) for Medication/Mood.    Beatrice Lecher, MD

## 2020-09-08 NOTE — Assessment & Plan Note (Signed)
Gets annual mammo and MRI.  Upcoming MRI scheduled.  Xanax sent to pharmacy.

## 2020-09-08 NOTE — Assessment & Plan Note (Signed)
Continue current regimen with citalopram.  We will make sure that she is got refill sent to the pharmacy for next month.  Follow-up in 6 months.  Please reach out if any problems concerns.

## 2020-09-08 NOTE — Progress Notes (Signed)
She reports that she is doing well on current regimen.

## 2020-10-01 ENCOUNTER — Other Ambulatory Visit: Payer: BC Managed Care – PPO

## 2020-10-12 ENCOUNTER — Ambulatory Visit
Admission: RE | Admit: 2020-10-12 | Discharge: 2020-10-12 | Disposition: A | Discharge status: Home / Self Care | Payer: BC Managed Care – PPO | Source: Ambulatory Visit | Attending: Obstetrics & Gynecology | Admitting: Obstetrics & Gynecology

## 2020-10-12 ENCOUNTER — Other Ambulatory Visit: Payer: Self-pay

## 2020-10-12 DIAGNOSIS — Z803 Family history of malignant neoplasm of breast: Secondary | ICD-10-CM | POA: Diagnosis not present

## 2020-10-12 DIAGNOSIS — N6012 Diffuse cystic mastopathy of left breast: Secondary | ICD-10-CM | POA: Diagnosis not present

## 2020-10-12 DIAGNOSIS — N6011 Diffuse cystic mastopathy of right breast: Secondary | ICD-10-CM | POA: Diagnosis not present

## 2020-10-12 MED ORDER — GADOBUTROL 1 MMOL/ML IV SOLN
6.0000 mL | Freq: Once | INTRAVENOUS | Status: AC | PRN
  Administered 2020-10-12: 6 mL via INTRAVENOUS

## 2021-03-28 ENCOUNTER — Ambulatory Visit: Payer: BC Managed Care – PPO | Admitting: Family Medicine

## 2021-04-04 ENCOUNTER — Other Ambulatory Visit: Payer: Self-pay | Admitting: Obstetrics & Gynecology

## 2021-04-04 ENCOUNTER — Other Ambulatory Visit: Payer: Self-pay | Admitting: Family Medicine

## 2021-04-04 DIAGNOSIS — Z1231 Encounter for screening mammogram for malignant neoplasm of breast: Secondary | ICD-10-CM

## 2021-04-28 ENCOUNTER — Other Ambulatory Visit: Payer: Self-pay | Admitting: Family Medicine

## 2021-05-06 ENCOUNTER — Ambulatory Visit (INDEPENDENT_AMBULATORY_CARE_PROVIDER_SITE_OTHER): Payer: BC Managed Care – PPO | Admitting: Family Medicine

## 2021-05-06 ENCOUNTER — Encounter: Payer: Self-pay | Admitting: Family Medicine

## 2021-05-06 VITALS — BP 110/64 | HR 66 | Ht 66.0 in | Wt 141.1 lb

## 2021-05-06 DIAGNOSIS — Z0000 Encounter for general adult medical examination without abnormal findings: Secondary | ICD-10-CM | POA: Diagnosis not present

## 2021-05-06 MED ORDER — CITALOPRAM HYDROBROMIDE 20 MG PO TABS: 20.0000 mg | ORAL_TABLET | Freq: Every day | ORAL | 3 refills | Status: DC

## 2021-05-06 NOTE — Progress Notes (Signed)
Subjective:     Ruth Garcia is a 50 y.o. female and is here for a comprehensive physical exam. The patient reports no problems.  She is exercising regularly and doing well on her current regimen of medications.  She has not had a period in over 11 months.  She does follow with Dr. Wiliam Ke at, her OB/GYN.  Social History   Socioeconomic History   Marital status: Married    Spouse name: Not on file   Number of children: Not on file   Years of education: Not on file   Highest education level: Not on file  Occupational History   Occupation: teacher   Tobacco Use   Smoking status: Never   Smokeless tobacco: Never  Vaping Use   Vaping Use: Never used  Substance and Sexual Activity   Alcohol use: Not Currently    Alcohol/week: 1.0 standard drink    Types: 1 Glasses of wine per week   Drug use: No   Sexual activity: Yes    Partners: Male    Birth control/protection: Surgical    Comment: husband had vasectomy  Other Topics Concern   Not on file  Social History Narrative   Oncologist at [employer].   Social Determinants of Health   Financial Resource Strain: Not on file  Food Insecurity: Not on file  Transportation Needs: Not on file  Physical Activity: Not on file  Stress: Not on file  Social Connections: Not on file  Intimate Partner Violence: Not on file   Health Maintenance  Topic Date Due   Pneumococcal Vaccine 59-76 Years old (1 - PCV) Never done   Zoster Vaccines- Shingrix (1 of 2) Never done   COVID-19 Vaccine (3 - Pfizer risk series) 02/06/2020   MAMMOGRAM  05/10/2021   INFLUENZA VACCINE  05/16/2021   TETANUS/TDAP  12/18/2023   PAP SMEAR-Modifier  05/17/2025   COLONOSCOPY (Pts 45-46yr Insurance coverage will need to be confirmed)  08/03/2030   Hepatitis C Screening  Completed   HIV Screening  Completed   HPV VACCINES  Aged Out    The following portions of the patient's history were reviewed and updated as appropriate: allergies, current  medications, past family history, past medical history, past social history, past surgical history, and problem list.  Review of Systems Pertinent items are noted in HPI.   Objective:    BP 110/64   Pulse 66   Ht '5\' 6"'$  (1.676 m)   Wt 141 lb 1.9 oz (64 kg)   SpO2 99%   BMI 22.78 kg/m  General appearance: alert, cooperative, and appears stated age Head: Normocephalic, without obvious abnormality, atraumatic Eyes:  conj clear, EOMI, PEERLA Ears: normal TM's and external ear canals both ears Nose: Nares normal. Septum midline. Mucosa normal. No drainage or sinus tenderness. Throat: lips, mucosa, and tongue normal; teeth and gums normal Neck: no adenopathy, no carotid bruit, no JVD, supple, symmetrical, trachea midline, and thyroid not enlarged, symmetric, no tenderness/mass/nodules Back: symmetric, no curvature. ROM normal. No CVA tenderness. Lungs: clear to auscultation bilaterally Breasts: normal appearance, no masses or tenderness Heart: regular rate and rhythm, S1, S2 normal, no murmur, click, rub or gallop Abdomen: soft, non-tender; bowel sounds normal; no masses,  no organomegaly Extremities: extremities normal, atraumatic, no cyanosis or edema Pulses: 2+ and symmetric Skin: Skin color, texture, turgor normal. No rashes or lesions Lymph nodes: Cervical, supraclavicular, and axillary nodes normal. Neurologic: Alert and oriented X 3, normal strength and tone. Normal symmetric reflexes. Normal  coordination and gait    Assessment:    Healthy female exam.      Plan:     See After Visit Summary for Counseling Recommendations  Keep up a regular exercise program and make sure you are eating a healthy diet Try to eat 4 servings of dairy a day, or if you are lactose intolerant take a calcium with vitamin D daily.  Your vaccines are up to date.

## 2021-05-09 DIAGNOSIS — E7800 Pure hypercholesterolemia, unspecified: Secondary | ICD-10-CM | POA: Diagnosis not present

## 2021-05-09 DIAGNOSIS — Z0000 Encounter for general adult medical examination without abnormal findings: Secondary | ICD-10-CM | POA: Diagnosis not present

## 2021-05-09 LAB — CBC
HCT: 42.1 % (ref 35.0–45.0)
Hemoglobin: 13.7 g/dL (ref 11.7–15.5)
MCH: 31.1 pg (ref 27.0–33.0)
MCHC: 32.5 g/dL (ref 32.0–36.0)
MCV: 95.5 fL (ref 80.0–100.0)
MPV: 11 fL (ref 7.5–12.5)
Platelets: 246 10*3/uL (ref 140–400)
RBC: 4.41 10*6/uL (ref 3.80–5.10)
RDW: 12.2 % (ref 11.0–15.0)
WBC: 3.9 10*3/uL (ref 3.8–10.8)

## 2021-05-09 LAB — COMPLETE METABOLIC PANEL WITH GFR
AG Ratio: 1.7 (calc) (ref 1.0–2.5)
ALT: 14 U/L (ref 6–29)
AST: 15 U/L (ref 10–35)
Albumin: 4.3 g/dL (ref 3.6–5.1)
Alkaline phosphatase (APISO): 46 U/L (ref 37–153)
BUN: 18 mg/dL (ref 7–25)
CO2: 29 mmol/L (ref 20–32)
Calcium: 8.9 mg/dL (ref 8.6–10.4)
Chloride: 102 mmol/L (ref 98–110)
Creat: 0.7 mg/dL (ref 0.50–1.03)
Globulin: 2.5 g/dL (calc) (ref 1.9–3.7)
Glucose, Bld: 89 mg/dL (ref 65–99)
Potassium: 4.2 mmol/L (ref 3.5–5.3)
Sodium: 138 mmol/L (ref 135–146)
Total Bilirubin: 0.6 mg/dL (ref 0.2–1.2)
Total Protein: 6.8 g/dL (ref 6.1–8.1)
eGFR: 105 mL/min/{1.73_m2} (ref >=60)

## 2021-05-09 LAB — LIPID PANEL W/REFLEX DIRECT LDL
Cholesterol: 191 mg/dL (ref <200)
HDL: 66 mg/dL (ref >=50)
LDL Cholesterol (Calc): 110 mg/dL (calc) — ABNORMAL HIGH
Non-HDL Cholesterol (Calc): 125 mg/dL (calc) (ref <130)
Total CHOL/HDL Ratio: 2.9 (calc) (ref <5.0)
Triglycerides: 68 mg/dL (ref <150)

## 2021-05-10 ENCOUNTER — Encounter: Payer: Self-pay | Admitting: Family Medicine

## 2021-05-23 ENCOUNTER — Encounter: Payer: Self-pay | Admitting: Obstetrics & Gynecology

## 2021-05-23 ENCOUNTER — Ambulatory Visit (INDEPENDENT_AMBULATORY_CARE_PROVIDER_SITE_OTHER): Payer: BC Managed Care – PPO | Admitting: Obstetrics & Gynecology

## 2021-05-23 ENCOUNTER — Other Ambulatory Visit: Payer: Self-pay

## 2021-05-23 VITALS — BP 114/61 | HR 58 | Ht 65.0 in | Wt 143.0 lb

## 2021-05-23 DIAGNOSIS — Z803 Family history of malignant neoplasm of breast: Secondary | ICD-10-CM

## 2021-05-23 DIAGNOSIS — Z01419 Encounter for gynecological examination (general) (routine) without abnormal findings: Secondary | ICD-10-CM | POA: Diagnosis not present

## 2021-05-23 NOTE — Progress Notes (Signed)
Mam scheduled for 05/24/21 Last pap 05/17/20-negative

## 2021-05-24 ENCOUNTER — Ambulatory Visit
Admission: RE | Admit: 2021-05-24 | Discharge: 2021-05-24 | Disposition: A | Discharge status: Home / Self Care | Payer: BC Managed Care – PPO | Source: Ambulatory Visit | Attending: Obstetrics & Gynecology | Admitting: Obstetrics & Gynecology

## 2021-05-24 DIAGNOSIS — Z1231 Encounter for screening mammogram for malignant neoplasm of breast: Secondary | ICD-10-CM

## 2021-05-26 ENCOUNTER — Ambulatory Visit: Payer: BC Managed Care – PPO

## 2021-05-30 ENCOUNTER — Other Ambulatory Visit: Payer: Self-pay | Admitting: Obstetrics & Gynecology

## 2021-05-30 DIAGNOSIS — R928 Other abnormal and inconclusive findings on diagnostic imaging of breast: Secondary | ICD-10-CM

## 2021-06-02 ENCOUNTER — Ambulatory Visit: Payer: Self-pay | Admitting: Physician Assistant

## 2021-06-02 ENCOUNTER — Other Ambulatory Visit: Payer: Self-pay

## 2021-06-02 DIAGNOSIS — Z9189 Other specified personal risk factors, not elsewhere classified: Secondary | ICD-10-CM

## 2021-06-02 NOTE — Progress Notes (Signed)
Order for breast MRI for 6 months from now placed per Dr.Leggett

## 2021-06-04 ENCOUNTER — Ambulatory Visit: Payer: BC Managed Care – PPO

## 2021-06-04 ENCOUNTER — Ambulatory Visit
Admission: RE | Admit: 2021-06-04 | Discharge: 2021-06-04 | Disposition: A | Discharge status: Home / Self Care | Payer: BC Managed Care – PPO | Source: Ambulatory Visit | Attending: Obstetrics & Gynecology | Admitting: Obstetrics & Gynecology

## 2021-06-04 ENCOUNTER — Other Ambulatory Visit: Payer: Self-pay

## 2021-06-04 DIAGNOSIS — R928 Other abnormal and inconclusive findings on diagnostic imaging of breast: Secondary | ICD-10-CM

## 2021-06-05 ENCOUNTER — Encounter: Payer: Self-pay | Admitting: Obstetrics & Gynecology

## 2021-06-05 NOTE — Progress Notes (Signed)
Subjective:     Ruth Garcia is a 50 y.o. female here for a routine exam.  Current complaints: almost went one year with out vaginal bleeding.  Bled small amount recently.   Gynecologic History No LMP recorded. Contraception:  surgical Last Pap: 05/2020. Results were: normal Last mammogram: Sees Dr. Donne Hazel for increased lifetime risk of breast cancer; get mammograms and MRIs every 6 months.  Next one is due this month (mammo)  Obstetric History OB History  Gravida Para Term Preterm AB Living  '2 2 2     2  '$ SAB IAB Ectopic Multiple Live Births               # Outcome Date GA Lbr Len/2nd Weight Sex Delivery Anes PTL Lv  2 Term           1 Term              The following portions of the patient's history were reviewed and updated as appropriate: allergies, current medications, past family history, past medical history, past social history, past surgical history, and problem list.  Review of Systems Pertinent items noted in HPI and remainder of comprehensive ROS otherwise negative.    Objective:     Vitals:   05/23/21 1520  BP: 114/61  Pulse: (!) 58  Weight: 143 lb (64.9 kg)  Height: '5\' 5"'$  (1.651 m)   Vitals:  WNL General appearance: alert, cooperative and no distress  HEENT: Normocephalic, without obvious abnormality, atraumatic Eyes: negative Throat: lips, mucosa, and tongue normal; teeth and gums normal  Respiratory: Clear to auscultation bilaterally  CV: Regular rate and rhythm  Breasts:  Normal appearance, no masses or tenderness, no nipple retraction or dimpling  GI: Soft, non-tender; bowel sounds normal; no masses,  no organomegaly  GU: External Genitalia:  Tanner V, no lesion Urethra:  No prolapse   Vagina: Pink, normal rugae, no blood or discharge  Cervix: No CMT, no lesion  Uterus:  Normal size and contour, non tender  Adnexa: Normal, no masses, non tender  Musculoskeletal: No edema, redness or tenderness in the calves or thighs  Skin: No lesions or  rash  Lymphatic: Axillary adenopathy: none     Psychiatric: Normal mood and behavior        Assessment:    Healthy female exam.    Plan:   Report vaginal bleeding.  Pt is very close to one year of no menstruation.  Had one episode of vaginal bleeding recently.  If reoccurs, pt is to call. Continue q6 month screening for breast cancer Vit D (400) and Calcium (1200) through diet and supplements Weight bearing exercise   Pneumococcal Vaccine 3-29 Years old (1 - PCV) Never done   Zoster Vaccines- Shingrix (1 of 2) Never done   COVID-19 Vaccine (3 - Pfizer risk series) 02/06/2020   MAMMOGRAM  05/10/2021   INFLUENZA VACCINE  05/16/2021   TETANUS/TDAP  12/18/2023   PAP SMEAR-Modifier  05/17/2025   COLONOSCOPY (Pts 45-22yr Insurance coverage will need to be confirmed)  08/03/2030   Hepatitis C Screening  Completed   HIV Screening  Completed   HPV VACCINES  Aged Out

## 2021-06-09 ENCOUNTER — Ambulatory Visit: Payer: BC Managed Care – PPO | Admitting: Physician Assistant

## 2021-07-06 ENCOUNTER — Ambulatory Visit (INDEPENDENT_AMBULATORY_CARE_PROVIDER_SITE_OTHER): Payer: BC Managed Care – PPO | Admitting: Physician Assistant

## 2021-07-06 ENCOUNTER — Encounter: Payer: Self-pay | Admitting: Physician Assistant

## 2021-07-06 ENCOUNTER — Other Ambulatory Visit: Payer: Self-pay

## 2021-07-06 DIAGNOSIS — D229 Melanocytic nevi, unspecified: Secondary | ICD-10-CM

## 2021-07-06 DIAGNOSIS — L821 Other seborrheic keratosis: Secondary | ICD-10-CM | POA: Diagnosis not present

## 2021-07-06 DIAGNOSIS — L814 Other melanin hyperpigmentation: Secondary | ICD-10-CM

## 2021-07-06 DIAGNOSIS — D1800 Hemangioma unspecified site: Secondary | ICD-10-CM

## 2021-07-06 DIAGNOSIS — R202 Paresthesia of skin: Secondary | ICD-10-CM | POA: Diagnosis not present

## 2021-07-06 DIAGNOSIS — Z1283 Encounter for screening for malignant neoplasm of skin: Secondary | ICD-10-CM | POA: Diagnosis not present

## 2021-07-06 DIAGNOSIS — L578 Other skin changes due to chronic exposure to nonionizing radiation: Secondary | ICD-10-CM

## 2021-07-06 NOTE — Progress Notes (Signed)
   Follow-Up Visit   Subjective  Ruth Garcia is a 50 y.o. female who presents for the following: Annual Exam (Right arm rash like itch nothing present comes and goes. No personal history of skin cancer or atypia ).   The following portions of the chart were reviewed this encounter and updated as appropriate:  Tobacco  Allergies  Meds  Problems  Med Hx  Surg Hx  Fam Hx      Objective  Well appearing patient in no apparent distress; mood and affect are within normal limits.  A full examination was performed including scalp, head, eyes, ears, nose, lips, neck, chest, axillae, abdomen, back, buttocks, bilateral upper extremities, bilateral lower extremities, hands, feet, fingers, toes, fingernails, and toenails. All findings within normal limits unless otherwise noted below.  Right Arm No skin presentation.    Assessment & Plan  Notalgia paresthetica Right Arm  Yearly skin check. No treatment available.  Lentigines - Scattered tan macules - Discussed due to sun exposure - Benign, observe - Call for any changes  Seborrheic Keratoses - Stuck-on, waxy, tan-brown papules and plaques  - Discussed benign etiology and prognosis. - Observe - Call for any changes  Melanocytic Nevi - Tan-brown and/or pink-flesh-colored symmetric macules and papules - Benign appearing on exam today - Observation - Call clinic for new or changing moles - Recommend daily use of broad spectrum spf 30+ sunscreen to sun-exposed areas.   Hemangiomas - Red papules - Discussed benign nature - Observe - Call for any changes  Actinic Damage - diffuse scaly erythematous macules with underlying dyspigmentation - Recommend daily broad spectrum sunscreen SPF 30+ to sun-exposed areas, reapply every 2 hours as needed.  - Call for new or changing lesions.  Skin cancer screening performed today.   I, Dontavious Emily, PA-C, have reviewed all documentation's for this visit.  The documentation on  07/06/21 for the exam, diagnosis, procedures and orders are all accurate and complete.

## 2021-09-05 ENCOUNTER — Ambulatory Visit: Payer: BC Managed Care – PPO | Admitting: Obstetrics & Gynecology

## 2021-09-05 ENCOUNTER — Encounter: Payer: Self-pay | Admitting: Obstetrics & Gynecology

## 2021-09-05 ENCOUNTER — Other Ambulatory Visit: Payer: Self-pay

## 2021-09-05 VITALS — BP 119/60 | HR 55 | Resp 16 | Ht 65.0 in | Wt 145.0 lb

## 2021-09-05 DIAGNOSIS — Z803 Family history of malignant neoplasm of breast: Secondary | ICD-10-CM | POA: Diagnosis not present

## 2021-09-05 DIAGNOSIS — N939 Abnormal uterine and vaginal bleeding, unspecified: Secondary | ICD-10-CM | POA: Diagnosis not present

## 2021-09-05 DIAGNOSIS — F40240 Claustrophobia: Secondary | ICD-10-CM | POA: Diagnosis not present

## 2021-09-05 DIAGNOSIS — N950 Postmenopausal bleeding: Secondary | ICD-10-CM | POA: Diagnosis not present

## 2021-09-05 MED ORDER — ALPRAZOLAM 0.5 MG PO TABS: ORAL_TABLET | ORAL | 0 refills | Status: DC

## 2021-09-05 NOTE — Progress Notes (Signed)
   Subjective:    Patient ID: Ruth Garcia, female    DOB: 1971-08-07, 50 y.o.   MRN: [id number]  HPI  50 yo menopausal female presents for PMB.  Patient is been 29-month since her last..  She stained for several days.  It is no longer present.  She did not have any breast tenderness.  She did feel cramping like she was about to start her period prior to the bleeding.  She is not on HRT.  Review of Systems  Constitutional: Negative.   Respiratory: Negative.    Cardiovascular: Negative.   Gastrointestinal: Negative.   Genitourinary: Negative.       Objective:   Physical Exam Vitals reviewed.  Constitutional:      General: She is not in acute distress.    Appearance: She is well-developed.  HENT:     Head: Normocephalic and atraumatic.  Eyes:     Conjunctiva/sclera: Conjunctivae normal.  Cardiovascular:     Rate and Rhythm: Normal rate.  Pulmonary:     Effort: Pulmonary effort is normal.  Genitourinary:    Comments: Tanner V Vulva:  No lesion Vagina:  Pink, no lesions, no discharge, no blood Cervix:  No CMT Uterus:  Non tender, mobile Right adnexa--non tender, no mass Left adnexa--non tender, no mass Skin:    General: Skin is warm and dry.  Neurological:     Mental Status: She is alert and oriented to person, place, and time.  Psychiatric:        Mood and Affect: Mood normal.   Vitals:   09/05/21 1516  BP: 119/60  Pulse: (!) 55  Resp: 16  Weight: 145 lb (65.8 kg)  Height: 5\' 5"  (1.651 m)       Assessment & Plan:  50 year old female with postmenopausal bleeding. Will order ultrasound to look at endometrial stripe.  If then, patient to monitor her cycles.  If thick, we will proceed with biopsy. Patient due for her MRI this month.  She requires anxiolytic prior to procedure and this was ordered today.  2 pills of Xanax sent to her pharmacy. Will base treatment plan on results.  30 minutes were spent reviewing the patient's record, during the exam, counseling,  and coordination of care.

## 2021-09-13 ENCOUNTER — Other Ambulatory Visit: Payer: Self-pay

## 2021-09-13 ENCOUNTER — Ambulatory Visit (INDEPENDENT_AMBULATORY_CARE_PROVIDER_SITE_OTHER): Payer: BC Managed Care – PPO

## 2021-09-13 DIAGNOSIS — N858 Other specified noninflammatory disorders of uterus: Secondary | ICD-10-CM | POA: Diagnosis not present

## 2021-09-13 DIAGNOSIS — R1909 Other intra-abdominal and pelvic swelling, mass and lump: Secondary | ICD-10-CM | POA: Diagnosis not present

## 2021-09-13 DIAGNOSIS — N939 Abnormal uterine and vaginal bleeding, unspecified: Secondary | ICD-10-CM | POA: Diagnosis not present

## 2021-09-19 ENCOUNTER — Telehealth: Payer: Self-pay | Admitting: *Deleted

## 2021-09-19 ENCOUNTER — Other Ambulatory Visit: Payer: Self-pay | Admitting: *Deleted

## 2021-09-19 MED ORDER — MISOPROSTOL 200 MCG PO TABS: 400.0000 ug | ORAL_TABLET | Freq: Once | ORAL | 0 refills | Status: DC

## 2021-09-19 NOTE — Progress Notes (Signed)
RX sent to [facility] for Cytoteto take PO the night prior to procedure per VO Dr Gala Romney.  Pt aware.

## 2021-09-19 NOTE — Telephone Encounter (Signed)
Left patient a message to call and schedule Endo Bio with Dr. Gala Romney.

## 2021-09-26 ENCOUNTER — Other Ambulatory Visit: Payer: Self-pay

## 2021-09-26 ENCOUNTER — Other Ambulatory Visit (HOSPITAL_COMMUNITY)
Admission: RE | Admit: 2021-09-26 | Discharge: 2021-09-26 | Disposition: A | Discharge status: Home / Self Care | Payer: BC Managed Care – PPO | Source: Ambulatory Visit | Attending: Obstetrics & Gynecology | Admitting: Obstetrics & Gynecology

## 2021-09-26 ENCOUNTER — Encounter: Payer: Self-pay | Admitting: Obstetrics & Gynecology

## 2021-09-26 ENCOUNTER — Ambulatory Visit: Payer: BC Managed Care – PPO | Admitting: Obstetrics & Gynecology

## 2021-09-26 VITALS — BP 121/73 | HR 65 | Ht 65.0 in | Wt 138.0 lb

## 2021-09-26 DIAGNOSIS — N950 Postmenopausal bleeding: Secondary | ICD-10-CM | POA: Insufficient documentation

## 2021-09-26 LAB — POCT URINE PREGNANCY: Preg Test, Ur: NEGATIVE

## 2021-09-26 NOTE — Progress Notes (Signed)
Subjective:    Patient ID: Ruth Garcia, female    DOB: 09-Feb-1971, 50 y.o.   MRN: [id number]  HPI 50 year old female presents complaining of postmenopausal bleeding.  Her transvaginal ultrasound results are as below.  Patient had staining approximately 14 months after entering menopause.  She is not currently having staining today.  No other complaints.  Patient wanted to discuss the ultrasound prior to moving to biopsy which we did.  I reviewed to the images with her and showed her the area of concern.  CLINICAL DATA:  Initial evaluation for vaginal bleeding. Evaluate endometrial stripe.   EXAM: ULTRASOUND PELVIS TRANSVAGINAL   TECHNIQUE: Transvaginal ultrasound examination of the pelvis was performed including evaluation of the uterus, ovaries, adnexal regions, and pelvic cul-de-sac.   COMPARISON:  None available.   FINDINGS: Uterus   Measurements: 7.6 x 4.0 x 4.7 cm = volume: 74.9 mL. Uterus is retroverted. 7 x 9 x 8 mm echogenic mass seen intimately associated with the endometrial complex at the level of the mid uterine body (image 43). No other discrete fibroid or other myometrial abnormality.   Endometrium   Thickness: 2.3 mm. 7 x 9 x 8 mm echogenic mass at the level of the mid uterine body. Associated vascularity (image 57).   Right ovary   Measurements: 2.5 x 0.8 x 1.6 cm = volume: 1.6 mL. Normal appearance/no adnexal mass.   Left ovary   Measurements: 2.9 x 0.8 x 1.7 cm = volume: 2.0 mL. Normal appearance/no adnexal mass.   Other findings:  Trace free fluid noted within the pelvis.   IMPRESSION: 1. 7 x 9 x 8 mm echogenic mass closely associated with the endometrial stripe at the level of the mid uterine body. Primary differential considerations include a submucosal fibroid versus focal endometrial lesion. Consider further evaluation with sonohysterogram for confirmation prior to hysteroscopy. Endometrial sampling should also be considered if patient  is at high risk for endometrial carcinoma. (Ref: Radiological Reasoning: Algorithmic Workup of Abnormal Vaginal Bleeding with Endovaginal Sonography and Sonohysterography. AJR 2008; 295:M84-13). 2. Otherwise normal sonographic appearance of the uterus and ovaries. 3. Trace free fluid within the pelvis. Review of Systems  Constitutional: Negative.   Respiratory: Negative.    Cardiovascular: Negative.   Gastrointestinal: Negative.   Genitourinary: Negative.        Staining-prompted TVUS      Objective:   Physical Exam Vitals reviewed.  Constitutional:      General: She is not in acute distress.    Appearance: She is well-developed.  HENT:     Head: Normocephalic and atraumatic.  Eyes:     Conjunctiva/sclera: Conjunctivae normal.  Cardiovascular:     Rate and Rhythm: Normal rate.  Pulmonary:     Effort: Pulmonary effort is normal.  Skin:    General: Skin is warm and dry.  Neurological:     Mental Status: She is alert and oriented to person, place, and time.  Psychiatric:        Mood and Affect: Mood normal.   Vitals:   09/26/21 0816  BP: 121/73  Pulse: 65  Weight: 138 lb (62.6 kg)  Height: 5\' 5"  (1.651 m)      Assessment & Plan:  50 year old female presents with vaginal staining approximately 14 months after entering menopause.  As described above, the patient was consented for the procedure and discussed limitations of ultrasound and biopsy.  Images were reviewed.  Endometrial biopsy performed as below.  Patient will schedule MRI of her breasts.  She wanted to get through this procedure first.  ENDOMETRIAL BIOPSY     The indications for endometrial biopsy were reviewed.   Risks of the biopsy including cramping, bleeding, infection, uterine perforation, inadequate specimen and need for additional procedures  were discussed. The patient states she understands and agrees to undergo procedure today. Consent was signed. Time out was performed. Urine HCG was negative. A  sterile speculum was placed in the patient's vagina and the cervix was prepped with Betadine. A single-toothed tenaculum was placed on the anterior lip of the cervix to stabilize it. The 3 mm pipelle was introduced into the endometrial cavity without difficulty to a depth of 7 cm, and a moderate amount of tissue was obtained and sent to pathology. The instruments were removed from the patient's vagina. Minimal bleeding from the cervix was noted. The patient tolerated the procedure well. Routine post-procedure instructions were given to the patient. The patient will follow up to review the results and for further management.

## 2021-09-28 LAB — SURGICAL PATHOLOGY

## 2021-09-29 ENCOUNTER — Other Ambulatory Visit: Payer: Self-pay

## 2021-09-29 ENCOUNTER — Encounter: Payer: Self-pay | Admitting: Obstetrics & Gynecology

## 2021-09-29 DIAGNOSIS — N9489 Other specified conditions associated with female genital organs and menstrual cycle: Secondary | ICD-10-CM

## 2021-09-29 NOTE — Progress Notes (Signed)
Sonohysterogram ordered per Dr.Leggett

## 2021-10-14 ENCOUNTER — Other Ambulatory Visit: Payer: BC Managed Care – PPO

## 2021-10-27 ENCOUNTER — Ambulatory Visit
Admission: RE | Admit: 2021-10-27 | Discharge: 2021-10-27 | Disposition: A | Discharge status: Home / Self Care | Payer: BC Managed Care – PPO | Source: Ambulatory Visit | Attending: Obstetrics & Gynecology | Admitting: Obstetrics & Gynecology

## 2021-10-27 DIAGNOSIS — Z9189 Other specified personal risk factors, not elsewhere classified: Secondary | ICD-10-CM

## 2021-10-27 DIAGNOSIS — N6011 Diffuse cystic mastopathy of right breast: Secondary | ICD-10-CM | POA: Diagnosis not present

## 2021-10-27 DIAGNOSIS — N6012 Diffuse cystic mastopathy of left breast: Secondary | ICD-10-CM | POA: Diagnosis not present

## 2021-10-27 MED ORDER — GADOBUTROL 1 MMOL/ML IV SOLN
6.0000 mL | Freq: Once | INTRAVENOUS | Status: AC | PRN
  Administered 2021-10-27: 6 mL via INTRAVENOUS

## 2021-11-03 ENCOUNTER — Other Ambulatory Visit: Payer: BC Managed Care – PPO

## 2021-11-21 ENCOUNTER — Ambulatory Visit
Admission: RE | Admit: 2021-11-21 | Discharge: 2021-11-21 | Disposition: A | Discharge status: Home / Self Care | Payer: BC Managed Care – PPO | Source: Ambulatory Visit | Attending: Obstetrics & Gynecology | Admitting: Obstetrics & Gynecology

## 2021-11-21 ENCOUNTER — Other Ambulatory Visit: Payer: Self-pay

## 2021-11-21 DIAGNOSIS — N854 Malposition of uterus: Secondary | ICD-10-CM | POA: Diagnosis not present

## 2021-11-21 DIAGNOSIS — N9489 Other specified conditions associated with female genital organs and menstrual cycle: Secondary | ICD-10-CM

## 2022-03-22 ENCOUNTER — Other Ambulatory Visit: Payer: Self-pay | Admitting: Obstetrics & Gynecology

## 2022-03-22 DIAGNOSIS — Z1231 Encounter for screening mammogram for malignant neoplasm of breast: Secondary | ICD-10-CM

## 2022-05-08 ENCOUNTER — Encounter: Payer: BC Managed Care – PPO | Admitting: Family Medicine

## 2022-05-11 ENCOUNTER — Encounter: Payer: Self-pay | Admitting: Family Medicine

## 2022-05-11 ENCOUNTER — Other Ambulatory Visit: Payer: Self-pay | Admitting: Family Medicine

## 2022-05-11 ENCOUNTER — Ambulatory Visit (INDEPENDENT_AMBULATORY_CARE_PROVIDER_SITE_OTHER): Payer: BC Managed Care – PPO | Admitting: Family Medicine

## 2022-05-11 VITALS — BP 114/65 | HR 61 | Ht 66.0 in | Wt 146.0 lb

## 2022-05-11 DIAGNOSIS — R7989 Other specified abnormal findings of blood chemistry: Secondary | ICD-10-CM | POA: Diagnosis not present

## 2022-05-11 DIAGNOSIS — L989 Disorder of the skin and subcutaneous tissue, unspecified: Secondary | ICD-10-CM

## 2022-05-11 DIAGNOSIS — Z0000 Encounter for general adult medical examination without abnormal findings: Secondary | ICD-10-CM | POA: Diagnosis not present

## 2022-05-11 DIAGNOSIS — Z1283 Encounter for screening for malignant neoplasm of skin: Secondary | ICD-10-CM

## 2022-05-11 MED ORDER — TRIAMCINOLONE ACETONIDE 0.1 % EX CREA: 1.0000 | TOPICAL_CREAM | Freq: Two times a day (BID) | CUTANEOUS | 0 refills | Status: DC

## 2022-05-11 NOTE — Progress Notes (Signed)
Complete physical exam  Patient: Ruth Garcia   DOB: 20-Jun-1971   51 y.o. Female  MRN: [id number]  Subjective:    Chief Complaint  Patient presents with   Annual Exam    Ruth Garcia is a 51 y.o. female who presents today for a complete physical exam. She reports consuming a general diet.  Works out about 4-5 days per week  She generally feels well. . She does have additional problems to discuss today.  She has been following with her OB/GYN Dr. Silas [place] for postmenopausal bleeding.   Sister was just dx with uterine cancer that was serous clear-cell stage Ia.  She is starting treatment soon status post hysterectomy.  He also has a skin lesion on her left forearm near the antecubital fossa she says its been there for well over a month maybe 6 weeks or so.  Its not bothersome painful or itchy but it also is not healing.  She has a wart on the dorsum of her right wrist.   Most recent fall risk assessment:    05/11/2022    1:57 PM  [place] in the past year? 0  Number falls in past yr: 0  Injury with Fall? 0  Risk for fall due to : No Fall Risks  Follow up Falls prevention discussed     Most recent depression screenings:    05/06/2021    1:52 PM 09/08/2020    8:23 AM  PHQ 2/9 Scores  PHQ - 2 Score 0 1  PHQ- 9 Score 3 4        Patient Care Team: Hali Marry, MD as PCP - General (Family Medicine) Warren Danes, PA-C as Physician Assistant (Dermatology)   Outpatient Medications Prior to Visit  Medication Sig   Calcium Carbonate-Vitamin D (CALCIUM + D PO) Take 1 tablet by mouth daily. Ultra Calcium 1200 mg daily   Cholecalciferol 2000 units CAPS Take 1 capsule by mouth daily.    citalopram (CELEXA) 20 MG tablet Take 1 tablet (20 mg total) by mouth daily.   fish oil-omega-3 fatty acids 1000 MG capsule Take 2 g by mouth daily. Omega Woman %00 mg Omega 3 76 mg GLA  Daily   OVER THE COUNTER MEDICATION Vital Proteins Collagen Peptides 1 scoop  daily   OVER THE COUNTER MEDICATION Vitafusions Womens Multi 2 gummies daily   Selenium 200 MCG CAPS Take by mouth.    TURMERIC CURCUMIN PO Take by mouth. 1000 mg daily   vitamin C (ASCORBIC ACID) 500 MG tablet Take 500 mg by mouth daily.   vitamin E 400 UNIT capsule Take 400 Units by mouth daily.   [DISCONTINUED] ALPRAZolam (XANAX) 0.5 MG tablet Take 1 tablet (0.5 mg total) by mouth once as needed for up to 1 dose for anxiety (procedure).   [DISCONTINUED] ALPRAZolam (XANAX) 0.5 MG tablet Take one table 1 hour prior to MRI; rpt as necessary on arrival   [DISCONTINUED] misoprostol (CYTOTEC) 200 MCG tablet Take 2 tablets (400 mcg total) by mouth once for 1 dose. Take 400 mcg total the night prior to procedure PO   [DISCONTINUED] OVER THE COUNTER MEDICATION CArdioLDL 2 capsules daily   [DISCONTINUED] OVER THE COUNTER MEDICATION Red Reishi Mushroom Powder 1/3 tsp daily   [DISCONTINUED] OVER THE COUNTER MEDICATION [place] Mushroom Powder 1/3 tsp daily   [DISCONTINUED] OVER THE COUNTER MEDICATION Take 1 capsule by mouth daily. MycoMune 4x 1 cap daily   No facility-administered medications prior  to visit.    ROS        Objective:     BP 114/65   Pulse 61   Ht '5\' 6"'$  (1.676 m)   Wt 146 lb (66.2 kg)   SpO2 98%   BMI 23.57 kg/m    Physical Exam Vitals reviewed.  Constitutional:      Appearance: She is well-developed.  HENT:     Head: Normocephalic and atraumatic.     Right Ear: Tympanic membrane, ear canal and external ear normal.     Left Ear: Tympanic membrane, ear canal and external ear normal.     Nose: Nose normal.     Mouth/Throat:     Pharynx: Oropharynx is clear.  Eyes:     Conjunctiva/sclera: Conjunctivae normal.     Pupils: Pupils are equal, round, and reactive to light.  Neck:     Thyroid: No thyromegaly.  Cardiovascular:     Rate and Rhythm: Normal rate and regular rhythm.     Heart sounds: Normal heart sounds.  Pulmonary:     Effort: Pulmonary effort is  normal.     Breath sounds: Normal breath sounds. No wheezing.  Musculoskeletal:     Cervical back: Neck supple.  Lymphadenopathy:     Cervical: No cervical adenopathy.  Skin:    General: Skin is warm and dry.     Coloration: Skin is not pale.  Neurological:     Mental Status: She is alert and oriented to person, place, and time.  Psychiatric:        Mood and Affect: Mood normal.        Behavior: Behavior normal.      No results found for any visits on 05/11/22.     Assessment & Plan:    Routine Health Maintenance and Physical Exam  Immunization History  Administered Date(s) Administered   Influenza-Unspecified 09/16/2019   PFIZER(Purple Top)SARS-COV-2 Vaccination 12/12/2019, 01/09/2020   PPD Test 09/27/2012   Tdap 12/17/2013    Health Maintenance  Topic Date Due   COVID-19 Vaccine (3 - Pfizer risk series) 05/28/2023 (Originally 02/06/2020)   Zoster Vaccines- Shingrix (1 of 2) 08/10/2023 (Originally 11/28/1988)   INFLUENZA VACCINE  05/16/2022   MAMMOGRAM  05/24/2022   TETANUS/TDAP  12/18/2023   PAP SMEAR-Modifier  05/17/2025   COLONOSCOPY (Pts 45-57yr Insurance coverage will need to be confirmed)  08/03/2030   Hepatitis C Screening  Completed   HIV Screening  Completed   Pneumococcal Vaccine [id number] old  Aged Out   HPV VACCINES  Aged Out    Discussed health benefits of physical activity, and encouraged her to engage in regular exercise appropriate for her age and condition.  Problem List Items Addressed This Visit   None Visit Diagnoses     Wellness examination    -  Primary   Relevant Orders   Lipid Panel w/reflex Direct LDL   COMPLETE METABOLIC PANEL WITH GFR   CBC   TSH   Skin lesion         Keep up a regular exercise program and make sure you are eating a healthy diet Try to eat 4 servings of dairy a day, or if you are lactose intolerant take a calcium with vitamin D daily.  Your vaccines are up to date.   Skin lesion on that left antecubital  fossa it has a very unusual look it is really shiny but it does not look vesicular and it is not itchy.  Ragona try treating it with  a topical steroid cream but I suspect that it really may need to be biopsied.  Basal cell skin cancer is a possibility on the differential.  She has a dermatologist that she typically sees in the fall so she will call and try to see if she can get a sooner appointment than November.  Meds ordered this encounter  Medications   triamcinolone cream (KENALOG) 0.1 %    Sig: Apply 1 Application topically 2 (two) times daily.    Dispense:  15 g    Refill:  0     Return in about 1 year (around 05/12/2023) for Wellness Exam.     Beatrice Lecher, MD

## 2022-05-16 DIAGNOSIS — Z0000 Encounter for general adult medical examination without abnormal findings: Secondary | ICD-10-CM | POA: Diagnosis not present

## 2022-05-18 NOTE — Progress Notes (Signed)
Hi Sabella, your TSH is slightly elevated. We will call lab ans see if we can add a free T4. All other labs look great!   Please call lab and add free T4

## 2022-05-19 LAB — COMPLETE METABOLIC PANEL WITH GFR
AG Ratio: 2 (calc) (ref 1.0–2.5)
ALT: 22 U/L (ref 6–29)
AST: 19 U/L (ref 10–35)
Albumin: 4.2 g/dL (ref 3.6–5.1)
Alkaline phosphatase (APISO): 46 U/L (ref 37–153)
BUN: 12 mg/dL (ref 7–25)
CO2: 28 mmol/L (ref 20–32)
Calcium: 9.2 mg/dL (ref 8.6–10.4)
Chloride: 103 mmol/L (ref 98–110)
Creat: 0.74 mg/dL (ref 0.50–1.03)
Globulin: 2.1 g/dL (calc) (ref 1.9–3.7)
Glucose, Bld: 93 mg/dL (ref 65–99)
Potassium: 4.8 mmol/L (ref 3.5–5.3)
Sodium: 139 mmol/L (ref 135–146)
Total Bilirubin: 0.8 mg/dL (ref 0.2–1.2)
Total Protein: 6.3 g/dL (ref 6.1–8.1)
eGFR: 97 mL/min/{1.73_m2} (ref >=60)

## 2022-05-19 LAB — CBC
HCT: 40.2 % (ref 35.0–45.0)
Hemoglobin: 13.4 g/dL (ref 11.7–15.5)
MCH: 31.5 pg (ref 27.0–33.0)
MCHC: 33.3 g/dL (ref 32.0–36.0)
MCV: 94.4 fL (ref 80.0–100.0)
MPV: 10.7 fL (ref 7.5–12.5)
Platelets: 223 10*3/uL (ref 140–400)
RBC: 4.26 10*6/uL (ref 3.80–5.10)
RDW: 12.1 % (ref 11.0–15.0)
WBC: 3.8 10*3/uL (ref 3.8–10.8)

## 2022-05-19 LAB — LIPID PANEL W/REFLEX DIRECT LDL
Cholesterol: 171 mg/dL (ref <200)
HDL: 64 mg/dL (ref >=50)
LDL Cholesterol (Calc): 93 mg/dL (calc)
Non-HDL Cholesterol (Calc): 107 mg/dL (calc) (ref <130)
Total CHOL/HDL Ratio: 2.7 (calc) (ref <5.0)
Triglycerides: 57 mg/dL (ref <150)

## 2022-05-19 LAB — T4, FREE: Free T4: 1 ng/dL (ref 0.8–1.8)

## 2022-05-19 LAB — TSH: TSH: 4.64 mIU/L — ABNORMAL HIGH

## 2022-05-19 NOTE — Progress Notes (Signed)
T4 is OK. Plan to recheck TSH and free T4 in 8-12 weeks. Maybe just a transient bump

## 2022-05-19 NOTE — Addendum Note (Signed)
Addended by: Peggye Ley on: 05/19/2022 01:19 PM   Modules accepted: Orders

## 2022-05-25 ENCOUNTER — Ambulatory Visit
Admission: RE | Admit: 2022-05-25 | Discharge: 2022-05-25 | Disposition: A | Discharge status: Home / Self Care | Payer: BC Managed Care – PPO | Source: Ambulatory Visit

## 2022-05-25 DIAGNOSIS — Z1231 Encounter for screening mammogram for malignant neoplasm of breast: Secondary | ICD-10-CM

## 2022-05-29 ENCOUNTER — Ambulatory Visit (INDEPENDENT_AMBULATORY_CARE_PROVIDER_SITE_OTHER): Payer: BC Managed Care – PPO | Admitting: Obstetrics & Gynecology

## 2022-05-29 ENCOUNTER — Encounter: Payer: Self-pay | Admitting: Obstetrics & Gynecology

## 2022-05-29 VITALS — BP 135/76 | HR 72 | Ht 65.0 in | Wt 143.0 lb

## 2022-05-29 DIAGNOSIS — Z9189 Other specified personal risk factors, not elsewhere classified: Secondary | ICD-10-CM

## 2022-05-29 DIAGNOSIS — Z01419 Encounter for gynecological examination (general) (routine) without abnormal findings: Secondary | ICD-10-CM

## 2022-05-29 NOTE — Progress Notes (Signed)
Subjective:     Ruth Garcia is a 51 y.o. female here for a routine exam.  Current complaints: sister breast cancer and now serous endometrial cancer.   Gynecologic History No LMP recorded. (Menstrual status: Perimenopausal). Contraception: post menopausal status Last Mammogram: 05/25/22- normal--Birads 1 Last Pap Smear:  05/17/20- negative Last Colon Screening;  2021; next one due 2031 Seat Belts:   Yes Sun Screen:   Yes Dental Check Up:  Yes Brush & Floss:  Yes  Obstetric History OB History  Gravida Para Term Preterm AB Living  '2 2 2     2  '$ SAB IAB Ectopic Multiple Live Births               # Outcome Date GA Lbr Len/2nd Weight Sex Delivery Anes PTL Lv  2 Term           1 Term              The following portions of the patient's history were reviewed and updated as appropriate: allergies, current medications, past family history, past medical history, past social history, past surgical history, and problem list.  Review of Systems Pertinent items noted in HPI and remainder of comprehensive ROS otherwise negative.    Objective:     Vitals:   05/29/22 0806  BP: 135/76  Pulse: 72  Weight: 143 lb (64.9 kg)  Height: '5\' 5"'$  (1.651 m)   Vitals:  WNL General appearance: alert, cooperative and no distress  HEENT: Normocephalic, without obvious abnormality, atraumatic Eyes: negative Throat: lips, mucosa, and tongue normal; teeth and gums normal  Respiratory: Clear to auscultation bilaterally  CV: Regular rate and rhythm  Breasts:  Normal appearance, no masses or tenderness, no nipple retraction or dimpling  GI: Soft, non-tender; bowel sounds normal; no masses,  no organomegaly  GU: External Genitalia:  Tanner V, no lesion Urethra:  No prolapse   Vagina: Pink, normal rugae, no blood or discharge  Cervix: No CMT, no lesion  Uterus:  Normal size and contour, non tender  Adnexa: Normal, no masses, non tender  Musculoskeletal: No edema, redness or tenderness in the calves  or thighs  Skin: Macule on left arm--going to dermatologist  Lymphatic: Axillary adenopathy: none     Psychiatric: Normal mood and behavior        Assessment:    Healthy female exam.    Plan:   Alternate mammo and MRI q 6 months for breast screening. Pap next year Discussed symptoms of ovarian and endometrial cancer. Derm for left arm macule.

## 2022-05-29 NOTE — Progress Notes (Signed)
Last Mammogram: 05/25/22- normal Last Pap Smear:  05/17/20- negative Last Colon Screening;  2021 Seat Belts:   Yes Sun Screen:   Yes Dental Check Up:  Yes Brush & Floss:  Yes

## 2022-06-14 ENCOUNTER — Ambulatory Visit
Admission: EM | Admit: 2022-06-14 | Discharge: 2022-06-14 | Disposition: A | Discharge status: Home / Self Care | Payer: BC Managed Care – PPO | Attending: Urgent Care | Admitting: Urgent Care

## 2022-06-14 DIAGNOSIS — R0982 Postnasal drip: Secondary | ICD-10-CM | POA: Diagnosis not present

## 2022-06-14 DIAGNOSIS — J040 Acute laryngitis: Secondary | ICD-10-CM

## 2022-06-14 DIAGNOSIS — H65113 Acute and subacute allergic otitis media (mucoid) (sanguinous) (serous), bilateral: Secondary | ICD-10-CM

## 2022-06-14 MED ORDER — PREDNISONE 20 MG PO TABS: 20.0000 mg | ORAL_TABLET | Freq: Every day | ORAL | 0 refills | Status: AC

## 2022-06-14 MED ORDER — AMOXICILLIN 500 MG PO CAPS: 1000.0000 mg | ORAL_CAPSULE | Freq: Two times a day (BID) | ORAL | 0 refills | Status: AC

## 2022-06-14 MED ORDER — BUDESONIDE 32 MCG/ACT NA SUSP
1.0000 | Freq: Every day | NASAL | 0 refills | Status: DC
Start: 2022-06-14 — End: 2023-05-15

## 2022-06-14 NOTE — ED Triage Notes (Signed)
Pt c/o nasal congestion and sore throat x 1 week. Denies fever. Also having some ear pressure and hoarse. Taking sudafed and tylenol then switched to Alkaseltzer prn.

## 2022-06-14 NOTE — ED Provider Notes (Signed)
[facility]    CSN: [id number] Arrival date & time: 06/14/22  1741      History   Chief Complaint Chief Complaint  Patient presents with   Sore Throat   Nasal Congestion   Hoarse    HPI Ruth Garcia is a 51 y.o. female.  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  She has not had a fever.  She is also having ear pressure, and describes development of hoarseness over the past several days.  She endorses postnasal drip.  Has been taking Sudafed and Tylenol previously, but this was ineffective so switched to Alka-Seltzer.  Denies any known COVID exposures at school.  Reports sore throat slightly worse at night.   Sore Throat   Past Medical History:  Diagnosis Date   Allergy    Anxiety    Depression     Patient Active Problem List   Diagnosis Date Noted   Elevated LDL cholesterol level 06/27/2019   Perineal cyst in female 05/21/2019   Depression with anxiety 08/29/2016   Family history of breast cancer 08/20/2013    Past Surgical History:  Procedure Laterality Date   BREAST BIOPSY Bilateral 04/2017   benign   dental implants     URETHRAL CYST REMOVAL     WISDOM TOOTH EXTRACTION      OB History     Gravida  2   Para  2   Term  2   Preterm      AB      Living  2      SAB      IAB      Ectopic      Multiple      Live Births               Home Medications    Prior to Admission medications   Medication Sig Start Date End Date Taking? Authorizing Provider  amoxicillin (AMOXIL) 500 MG capsule Take 2 capsules (1,000 mg total) by mouth 2 (two) times daily for 10 days. 06/14/22 06/24/22 Yes Kalisi Bevill L, PA  budesonide (RHINOCORT AQUA) 32 MCG/ACT nasal spray Place 1 spray into both nostrils daily. 06/14/22  Yes Alejandro Gamel L, PA  predniSONE (DELTASONE) 20 MG tablet Take 1 tablet (20 mg total) by mouth daily with breakfast for 3 days. 06/14/22 06/17/22 Yes  Fotini Lemus L, PA  Calcium Carbonate-Vitamin D (CALCIUM + D PO) Take 1 tablet by mouth daily. Ultra Calcium 1200 mg daily    [provider]  Cholecalciferol 2000 units CAPS Take 1 capsule by mouth daily.     [provider]  citalopram (CELEXA) 20 MG tablet TAKE 1 TABLET BY MOUTH EVERY DAY 05/11/22   Hali Marry, MD  fish oil-omega-3 fatty acids 1000 MG capsule Take 2 g by mouth daily. Omega Woman %00 mg Omega 3 76 mg GLA  Daily    [provider]  OVER THE COUNTER MEDICATION Vital Proteins Collagen Peptides 1 scoop daily    [provider]  OVER THE COUNTER MEDICATION Vitafusions Womens Multi 2 gummies daily    [provider]  Selenium 200 MCG CAPS Take by mouth.     [provider]  triamcinolone cream (KENALOG) 0.1 % Apply 1 Application topically 2 (two) times daily. 05/11/22   Hali Marry, MD  TURMERIC CURCUMIN PO Take by mouth. 1000 mg daily    [provider]  vitamin C (ASCORBIC ACID) 500 MG tablet Take 500 mg by mouth daily.    [provider]  vitamin E 400 UNIT capsule Take 400 Units by mouth daily.    [provider]    Family History Family History  Problem Relation Age of Onset   Esophageal cancer Paternal Grandfather    Thyroid disease Mother    Breast cancer Sister    Endometrial cancer Sister    Uterine cancer Sister    Thyroid disease Daughter    Cancer Maternal Aunt        2 with breast and one with ovarian   Breast cancer Maternal Aunt        x2   Cancer Maternal Uncle        colon   Colon cancer Maternal Uncle    Colon polyps Neg Hx    Rectal cancer Neg Hx    Stomach cancer Neg Hx     Social History Social History   Tobacco Use   Smoking status: Never   Smokeless tobacco: Never  Vaping Use   Vaping Use: Never used  Substance Use Topics   Alcohol use: Not Currently    Alcohol/week: 1.0 standard drink of alcohol    Types: 1 Glasses of wine per week    Drug use: No     Allergies   Patient has no known allergies.   Review of Systems Review of Systems As per HPI  Physical Exam Triage Vital Signs ED Triage Vitals  Enc Vitals Group     BP 06/14/22 1749 126/81     Pulse Rate 06/14/22 1749 62     Resp 06/14/22 1749 16     Temp 06/14/22 1749 98.6 F (37 C)     Temp Source 06/14/22 1749 Oral     SpO2 06/14/22 1749 99 %     Weight --      Height --      Head Circumference --      Peak Flow --      Pain Score 06/14/22 1751 0     Pain Loc --      Pain Edu? --      Excl. in [place]? --    No data found.  Updated Vital Signs BP 126/81 (BP Location: Right Arm)   Pulse 62   Temp 98.6 F (37 C) (Oral)   Resp 16   SpO2 99%   Visual Acuity Right Eye Distance:   Left Eye Distance:   Bilateral Distance:    Right Eye Near:   Left Eye Near:    Bilateral Near:     Physical Exam Vitals and nursing note reviewed.  Constitutional:      General: She is not in acute distress.    Appearance: She is well-developed and normal weight. She is not ill-appearing, toxic-appearing or diaphoretic.     Comments: Hoarse, raspy voice  HENT:     Head: Normocephalic and atraumatic.     Right Ear: Ear canal normal. No drainage, swelling or tenderness. A middle ear effusion is present. Tympanic membrane is erythematous.     Left Ear: Ear canal normal. No drainage, swelling or tenderness. A middle ear effusion is present. Tympanic membrane is erythematous.     Nose: Congestion and rhinorrhea present.     Mouth/Throat:     Mouth: Mucous membranes are moist. No oral lesions.     Pharynx: Oropharynx is clear. No pharyngeal swelling, oropharyngeal exudate, posterior oropharyngeal erythema or uvula swelling.  Comments: Visible post nasal drainage Eyes:     Extraocular Movements:     Right eye: Normal extraocular motion.     Left eye: Normal extraocular motion.     Conjunctiva/sclera: Conjunctivae normal.     Pupils: Pupils are equal, round, and  reactive to light.  Neck:     Thyroid: No thyromegaly.  Cardiovascular:     Rate and Rhythm: Normal rate and regular rhythm.     Heart sounds: Normal heart sounds. No murmur heard.    No friction rub.  Pulmonary:     Effort: Pulmonary effort is normal. No respiratory distress.     Breath sounds: Normal breath sounds. No stridor. No wheezing, rhonchi or rales.  Chest:     Chest wall: No tenderness.  Abdominal:     Palpations: Abdomen is soft.     Tenderness: There is no abdominal tenderness.  Musculoskeletal:        General: No swelling.     Cervical back: Normal range of motion and neck supple.  Lymphadenopathy:     Cervical: No cervical adenopathy.  Skin:    General: Skin is warm and dry.     Capillary Refill: Capillary refill takes less than 2 seconds.  Neurological:     General: No focal deficit present.     Mental Status: She is alert and oriented to person, place, and time.  Psychiatric:        Mood and Affect: Mood normal.      UC Treatments / Results  Labs (all labs ordered are listed, but only abnormal results are displayed) Labs Reviewed - No data to display  EKG   Radiology No results found.  Procedures Procedures (including critical care time)  Medications Ordered in UC Medications - No data to display  Initial Impression / Assessment and Plan / UC Course  I have reviewed the triage vital signs and the nursing notes.  Pertinent labs & imaging results that were available during my care of the patient were reviewed by me and considered in my medical decision making (see chart for details).     Acute mucoid OM B ears - antibiotics BID, start amoxil Post nasal drainage - suspected cause of sore throat. No indication for rapid strep testing Laryngitis - possibly secondary to #2. Supportive measures. If significantly worsening, consider prednisone, but hold off until Friday or Saturday to monitor response to abx and nasal sprays first.  Final Clinical  Impressions(s) / UC Diagnoses   Final diagnoses:  Acute mucoid otitis media of both ears  Post-nasal drainage  Laryngitis     Discharge Instructions      Please start taking the antibiotic twice daily as prescribed until gone. Use budesonide nasal spray in place of the Flonase to help open up your eustachian tubes. Voice rest and supportive care including salt water gargles is the best treatment for laryngitis. Should it worsen however or if you lose your voice completely, go ahead and start taking the prednisone called in today.     ED Prescriptions     Medication Sig Dispense Auth. Provider   budesonide (RHINOCORT AQUA) 32 MCG/ACT nasal spray Place 1 spray into both nostrils daily. 5 mL Tayah Idrovo L, PA   amoxicillin (AMOXIL) 500 MG capsule Take 2 capsules (1,000 mg total) by mouth 2 (two) times daily for 10 days. 40 capsule Olla Delancey L, PA   predniSONE (DELTASONE) 20 MG tablet Take 1 tablet (20 mg total) by mouth daily with breakfast for  3 days. 3 tablet Hakop Humbarger L, PA      PDMP not reviewed this encounter.   Chaney Malling, [place] 06/14/22 2011

## 2022-06-14 NOTE — Discharge Instructions (Signed)
Please start taking the antibiotic twice daily as prescribed until gone. Use budesonide nasal spray in place of the Flonase to help open up your eustachian tubes. Voice rest and supportive care including salt water gargles is the best treatment for laryngitis. Should it worsen however or if you lose your voice completely, go ahead and start taking the prednisone called in today.

## 2022-06-22 DIAGNOSIS — L578 Other skin changes due to chronic exposure to nonionizing radiation: Secondary | ICD-10-CM | POA: Diagnosis not present

## 2022-06-22 DIAGNOSIS — L821 Other seborrheic keratosis: Secondary | ICD-10-CM | POA: Diagnosis not present

## 2022-06-22 DIAGNOSIS — D485 Neoplasm of uncertain behavior of skin: Secondary | ICD-10-CM | POA: Diagnosis not present

## 2022-06-22 DIAGNOSIS — L814 Other melanin hyperpigmentation: Secondary | ICD-10-CM | POA: Diagnosis not present

## 2022-07-06 ENCOUNTER — Ambulatory Visit: Payer: BC Managed Care – PPO | Admitting: Physician Assistant

## 2022-07-10 ENCOUNTER — Telehealth: Payer: BC Managed Care – PPO | Admitting: Family Medicine

## 2022-07-10 NOTE — Progress Notes (Deleted)
    Virtual Visit via Video Note  I connected with Ruth Garcia on 07/10/22 at 10:50 AM EDT by a video enabled telemedicine application and verified that I am speaking with the correct person using two identifiers.   I discussed the limitations of evaluation and management by telemedicine and the availability of in person appointments. The patient expressed understanding and agreed to proceed.  Patient location: at home Provider location: in office  Subjective:    CC:  No chief complaint on file.   HPI:    Past medical history, Surgical history, Family history not pertinant except as noted below, Social history, Allergies, and medications have been entered into the medical record, reviewed, and corrections made.    Objective:    General: Speaking clearly in complete sentences without any shortness of breath.  Alert and oriented x3.  Normal judgment. No apparent acute distress.    Impression and Recommendations:    Problem List Items Addressed This Visit   None   No orders of the defined types were placed in this encounter.   No orders of the defined types were placed in this encounter.    I discussed the assessment and treatment plan with the patient. The patient was provided an opportunity to ask questions and all were answered. The patient agreed with the plan and demonstrated an understanding of the instructions.   The patient was advised to call back or seek an in-person evaluation if the symptoms worsen or if the condition fails to improve as anticipated.   Beatrice Lecher, MD

## 2022-07-24 DIAGNOSIS — M9903 Segmental and somatic dysfunction of lumbar region: Secondary | ICD-10-CM | POA: Diagnosis not present

## 2022-07-24 DIAGNOSIS — M4728 Other spondylosis with radiculopathy, sacral and sacrococcygeal region: Secondary | ICD-10-CM | POA: Diagnosis not present

## 2022-07-24 DIAGNOSIS — M4726 Other spondylosis with radiculopathy, lumbar region: Secondary | ICD-10-CM | POA: Diagnosis not present

## 2022-07-24 DIAGNOSIS — M9904 Segmental and somatic dysfunction of sacral region: Secondary | ICD-10-CM | POA: Diagnosis not present

## 2022-07-28 ENCOUNTER — Other Ambulatory Visit: Payer: BC Managed Care – PPO

## 2022-07-28 DIAGNOSIS — R7989 Other specified abnormal findings of blood chemistry: Secondary | ICD-10-CM | POA: Diagnosis not present

## 2022-07-28 LAB — TSH+FREE T4: TSH W/REFLEX TO FT4: 3.86 mIU/L

## 2022-07-29 NOTE — Progress Notes (Signed)
TSH is OK at 3.8.

## 2022-08-16 ENCOUNTER — Encounter: Payer: Self-pay | Admitting: Obstetrics & Gynecology

## 2022-08-16 ENCOUNTER — Other Ambulatory Visit: Payer: Self-pay | Admitting: Obstetrics & Gynecology

## 2022-08-16 DIAGNOSIS — Z1239 Encounter for other screening for malignant neoplasm of breast: Secondary | ICD-10-CM

## 2022-08-16 MED ORDER — ALPRAZOLAM 0.5 MG PO TABS: ORAL_TABLET | ORAL | 0 refills | Status: DC

## 2022-08-16 NOTE — Progress Notes (Signed)
High risk breast cancer--MRI and mammograms alternate q g months.  Xanax for anxiety prior to procedure.

## 2022-09-05 ENCOUNTER — Ambulatory Visit: Payer: BC Managed Care – PPO | Admitting: Physician Assistant

## 2022-10-22 ENCOUNTER — Other Ambulatory Visit: Payer: BC Managed Care – PPO

## 2022-10-28 ENCOUNTER — Ambulatory Visit
Admission: RE | Admit: 2022-10-28 | Discharge: 2022-10-28 | Disposition: A | Discharge status: Home / Self Care | Payer: BC Managed Care – PPO | Source: Ambulatory Visit | Attending: Obstetrics & Gynecology | Admitting: Obstetrics & Gynecology

## 2022-10-28 DIAGNOSIS — Z1239 Encounter for other screening for malignant neoplasm of breast: Secondary | ICD-10-CM

## 2022-10-28 MED ORDER — GADOPICLENOL 0.5 MMOL/ML IV SOLN
7.0000 mL | Freq: Once | INTRAVENOUS | Status: AC | PRN
  Administered 2022-10-28: 7 mL via INTRAVENOUS

## 2023-03-13 ENCOUNTER — Other Ambulatory Visit: Payer: Self-pay | Admitting: Obstetrics & Gynecology

## 2023-03-13 DIAGNOSIS — Z1231 Encounter for screening mammogram for malignant neoplasm of breast: Secondary | ICD-10-CM

## 2023-05-06 ENCOUNTER — Other Ambulatory Visit: Payer: Self-pay | Admitting: Family Medicine

## 2023-05-07 NOTE — Telephone Encounter (Signed)
Hold for 05/15/2023 appt.

## 2023-05-15 ENCOUNTER — Encounter: Payer: Self-pay | Admitting: Family Medicine

## 2023-05-15 ENCOUNTER — Ambulatory Visit (INDEPENDENT_AMBULATORY_CARE_PROVIDER_SITE_OTHER): Payer: BC Managed Care – PPO | Admitting: Family Medicine

## 2023-05-15 VITALS — BP 117/66 | HR 62 | Ht 65.0 in | Wt 151.0 lb

## 2023-05-15 DIAGNOSIS — Z0000 Encounter for general adult medical examination without abnormal findings: Secondary | ICD-10-CM

## 2023-05-15 NOTE — Patient Instructions (Addendum)
Crease citalopram to 10 mg daily for 6 to 8 weeks, then give [place] a call so I can call you in some of the 10 mg tabs so that you can split those in half and take 5 mg daily for 2 weeks and then 5 mg every other day for 2 weeks and then stop.  You feel like the drop to 10 mg is causing some symptoms we can always go to 15 mg total and then work down to 10 mg.  Just let me know

## 2023-05-15 NOTE — Progress Notes (Signed)
Complete physical exam  Patient: Ruth Garcia   DOB: 05-Oct-1971   52 y.o. Female  MRN: [id number]  Subjective:    Chief Complaint  Patient presents with   Annual Exam    Pt is fasting, she has had her Mammogram.     Ruth Garcia is a 52 y.o. female who presents today for a complete physical exam. She reports consuming a general diet.  Weights and walking 4 days per week.   She generally feels well. She reports sleeping well. She does have additional problems to discuss today.   He did update me that her sister that had breast cancer this summer was also diagnosed with uterine cancer.  Does follow-up with her OB/GYN next month.  Most recent fall risk assessment:    05/15/2023    8:42 AM  Fall Risk   Falls in the past year? 0  Number falls in past yr: 0  Injury with Fall? 0  Risk for fall due to : No Fall Risks  Follow up Falls evaluation completed     Most recent depression screenings:    05/15/2023    8:42 AM 05/06/2021    1:52 PM  PHQ 2/9 Scores  PHQ - 2 Score 0 0  PHQ- 9 Score  3        Patient Care Team: Agapito Games, MD as PCP - General (Family Medicine) Glyn Ade, PA-C as Physician Assistant (Dermatology)   Outpatient Medications Prior to Visit  Medication Sig   Calcium Carbonate-Vitamin D (CALCIUM + D PO) Take 1 tablet by mouth daily. Ultra Calcium 1200 mg daily   citalopram (CELEXA) 20 MG tablet TAKE 1 TABLET BY MOUTH EVERY DAY   Selenium 200 MCG CAPS Take by mouth.    TURMERIC CURCUMIN PO Take by mouth. 1000 mg daily   Cholecalciferol 2000 units CAPS Take 1 capsule by mouth daily.    fish oil-omega-3 fatty acids 1000 MG capsule Take 2 g by mouth daily. Omega Woman %00 mg Omega 3 76 mg GLA  Daily   OVER THE COUNTER MEDICATION Vital Proteins Collagen Peptides 1 scoop daily   OVER THE COUNTER MEDICATION Vitafusions Womens Multi 2 gummies daily   vitamin C (ASCORBIC ACID) 500 MG tablet Take 500 mg by mouth daily.   vitamin E 400 UNIT  capsule Take 400 Units by mouth daily.   [DISCONTINUED] ALPRAZolam (XANAX) 0.5 MG tablet Take before procedure as directed   [DISCONTINUED] budesonide (RHINOCORT AQUA) 32 MCG/ACT nasal spray Place 1 spray into both nostrils daily.   [DISCONTINUED] triamcinolone cream (KENALOG) 0.1 % Apply 1 Application topically 2 (two) times daily.   No facility-administered medications prior to visit.    ROS        Objective:     BP 117/66   Pulse 62   Ht 5\' 5"  (1.651 m)   Wt 151 lb (68.5 kg)   SpO2 100%   BMI 25.13 kg/m     Physical Exam Vitals and nursing note reviewed.  Constitutional:      Appearance: She is well-developed.  HENT:     Head: Normocephalic and atraumatic.     Right Ear: Tympanic membrane, ear canal and external ear normal.     Left Ear: Tympanic membrane, ear canal and external ear normal.     Nose: Nose normal.     Mouth/Throat:     Pharynx: Oropharynx is clear.  Eyes:     Conjunctiva/sclera: Conjunctivae normal.  Pupils: Pupils are equal, round, and reactive to light.  Neck:     Thyroid: No thyromegaly.  Cardiovascular:     Rate and Rhythm: Normal rate and regular rhythm.     Heart sounds: Normal heart sounds.  Pulmonary:     Effort: Pulmonary effort is normal.     Breath sounds: Normal breath sounds. No wheezing.  Abdominal:     General: Bowel sounds are normal.     Palpations: Abdomen is soft.  Musculoskeletal:     Cervical back: Neck supple.  Lymphadenopathy:     Cervical: No cervical adenopathy.  Skin:    General: Skin is warm and dry.  Neurological:     Mental Status: She is alert and oriented to person, place, and time.  Psychiatric:        Behavior: Behavior normal.      No results found for any visits on 05/15/23.      Assessment & Plan:    Routine Health Maintenance and Physical Exam  Immunization History  Administered Date(s) Administered   Influenza-Unspecified 09/16/2019   PFIZER(Purple Top)SARS-COV-2 Vaccination  12/12/2019, 01/09/2020   PPD Test 09/27/2012   Tdap 12/17/2013    Health Maintenance  Topic Date Due   Zoster Vaccines- Shingrix (1 of 2) 08/10/2023 (Originally 11/29/2019)   COVID-19 Vaccine (3 - 2023-24 season) 10/16/2023 (Originally 06/16/2022)   INFLUENZA VACCINE  05/17/2023   MAMMOGRAM  10/29/2023   DTaP/Tdap/Td (2 - Td or Tdap) 12/18/2023   PAP SMEAR-Modifier  05/17/2025   Colonoscopy  08/03/2030   Hepatitis C Screening  Completed   HIV Screening  Completed   Pneumococcal Vaccine 64-66 Years old  Aged Out   HPV VACCINES  Aged Out    Discussed health benefits of physical activity, and encouraged her to engage in regular exercise appropriate for her age and condition.  Problem List Items Addressed This Visit   None Visit Diagnoses     Wellness examination    -  Primary   Relevant Orders   CMP14+EGFR   Lipid Panel With LDL/HDL Ratio   TSH      Return in about 1 year (around 05/14/2024) for Wellness Exam.    Keep up a regular exercise program and make sure you are eating a healthy diet Try to eat 4 servings of dairy a day, or if you are lactose intolerant take a calcium with vitamin D daily.  Your vaccines are up to date.   Small sebaceous cyst on scalp she has a follow-up with her dermatologist in a couple months.  We discussed that it can be removed.   Declined Shingles vaccine.  Nani Gasser, MD

## 2023-05-16 NOTE — Progress Notes (Signed)
Hi Ruth Garcia, total cholesterol and LDL are little elevated.  LDL was 131 just encouraged her to continue to work on healthy diet and regular exercise.  Metabolic panel overall looks good.  Thyroid level looks good at 2.6.  The 10-year ASCVD risk score (Arnett DK, et al., 2019) is: 1.1%   Values used to calculate the score:     Age: 52 years     Sex: Female     Is Non-Hispanic African American: No     Diabetic: No     Tobacco smoker: No     Systolic Blood Pressure: 117 mmHg     Is BP treated: No     HDL Cholesterol: 77 mg/dL     Total Cholesterol: 220 mg/dL

## 2023-05-18 ENCOUNTER — Encounter: Payer: Self-pay | Admitting: Family Medicine

## 2023-05-28 ENCOUNTER — Ambulatory Visit
Admission: RE | Admit: 2023-05-28 | Discharge: 2023-05-28 | Disposition: A | Discharge status: Home / Self Care | Payer: BC Managed Care – PPO | Source: Ambulatory Visit

## 2023-05-28 DIAGNOSIS — Z1231 Encounter for screening mammogram for malignant neoplasm of breast: Secondary | ICD-10-CM | POA: Diagnosis not present

## 2023-05-30 NOTE — Progress Notes (Signed)
Please call patient. Normal mammogram.  Repeat in 1 year.  

## 2023-06-05 ENCOUNTER — Telehealth: Payer: Self-pay | Admitting: *Deleted

## 2023-06-05 NOTE — Telephone Encounter (Signed)
Left patient an urgent message to reschedule appointment due to provider not being in the office.

## 2023-06-11 ENCOUNTER — Ambulatory Visit: Payer: BC Managed Care – PPO | Admitting: Obstetrics & Gynecology

## 2023-06-22 NOTE — Progress Notes (Unsigned)
   Subjective:     Ruth Garcia is a 52 y.o. female here for a routine exam.  Current complaints: none.     Gynecologic History No LMP recorded. (Menstrual status: Perimenopausal). Contraception: post menopausal status Last Mammogram: 05/28/23- negative Last Pap Smear:  05/17/20- negative Last Colon Screening;  2021 Seat Belts:   yes  Sun Screen:   yes Dental Check Up:  yes Brush & Floss:  yes   Obstetric History OB History  Gravida Para Term Preterm AB Living  2 2 2     2   SAB IAB Ectopic Multiple Live Births               # Outcome Date GA Lbr Len/2nd Weight Sex Type Anes PTL Lv  2 Term           1 Term              The following portions of the patient's history were reviewed and updated as appropriate: allergies, current medications, past family history, past medical history, past social history, past surgical history, and problem list.  Review of Systems Pertinent items noted in HPI and remainder of comprehensive ROS otherwise negative.    Objective:     Vitals:   06/25/23 1557  BP: 127/71  Pulse: (!) 58  Weight: 157 lb (71.2 kg)  Height: 5\' 5"  (1.651 m)   Vitals:  WNL General appearance: alert, cooperative and no distress  HEENT: Normocephalic, without obvious abnormality, atraumatic Eyes: negative Throat: lips, mucosa, and tongue normal; teeth and gums normal  Respiratory: Clear to auscultation bilaterally  CV: Regular rate and rhythm  Breasts:  Normal appearance, no masses or tenderness, no nipple retraction or dimpling  GI: Soft, non-tender; bowel sounds normal; no masses,  no organomegaly  GU: External Genitalia:  Tanner V, no lesion Urethra:  No prolapse   Vagina: Pink, normal rugae, no blood or discharge  Cervix: No CMT, no lesion  Uterus:  Normal size and contour, non tender  Adnexa: Normal, no masses, non tender  Musculoskeletal: No edema, redness or tenderness in the calves or thighs  Skin: No lesions or rash  Lymphatic: Axillary adenopathy:  none     Psychiatric: Normal mood and behavior      Assessment:    Healthy female exam.    Plan:   1,  Pap smear with co testing 2.  Alternating mammogram and breast MRI q 6 months 3.  Colon cancer screening up to date 4.  Reviewed signs and symptoms of ovarian cancer 5.  Optimize Ca and vit D intake

## 2023-06-25 ENCOUNTER — Ambulatory Visit (INDEPENDENT_AMBULATORY_CARE_PROVIDER_SITE_OTHER): Payer: BC Managed Care – PPO | Admitting: Obstetrics & Gynecology

## 2023-06-25 ENCOUNTER — Encounter: Payer: Self-pay | Admitting: Obstetrics & Gynecology

## 2023-06-25 ENCOUNTER — Other Ambulatory Visit (HOSPITAL_COMMUNITY)
Admission: RE | Admit: 2023-06-25 | Discharge: 2023-06-25 | Disposition: A | Discharge status: Home / Self Care | Payer: BC Managed Care – PPO | Source: Ambulatory Visit | Attending: Obstetrics & Gynecology | Admitting: Obstetrics & Gynecology

## 2023-06-25 VITALS — BP 127/71 | HR 58 | Ht 65.0 in | Wt 157.0 lb

## 2023-06-25 DIAGNOSIS — Z01419 Encounter for gynecological examination (general) (routine) without abnormal findings: Secondary | ICD-10-CM

## 2023-06-28 LAB — CYTOLOGY - PAP
Comment: NEGATIVE
Diagnosis: HIGH — AB
High risk HPV: NEGATIVE

## 2023-07-02 ENCOUNTER — Encounter: Payer: Self-pay | Admitting: Obstetrics & Gynecology

## 2023-07-09 DIAGNOSIS — L578 Other skin changes due to chronic exposure to nonionizing radiation: Secondary | ICD-10-CM | POA: Diagnosis not present

## 2023-07-09 DIAGNOSIS — L821 Other seborrheic keratosis: Secondary | ICD-10-CM | POA: Diagnosis not present

## 2023-07-09 DIAGNOSIS — L814 Other melanin hyperpigmentation: Secondary | ICD-10-CM | POA: Diagnosis not present

## 2023-07-09 DIAGNOSIS — B079 Viral wart, unspecified: Secondary | ICD-10-CM | POA: Diagnosis not present

## 2023-07-09 DIAGNOSIS — D225 Melanocytic nevi of trunk: Secondary | ICD-10-CM | POA: Diagnosis not present

## 2023-07-16 ENCOUNTER — Encounter: Payer: Self-pay | Admitting: Obstetrics & Gynecology

## 2023-07-16 ENCOUNTER — Ambulatory Visit (INDEPENDENT_AMBULATORY_CARE_PROVIDER_SITE_OTHER): Payer: BC Managed Care – PPO | Admitting: Obstetrics & Gynecology

## 2023-07-16 ENCOUNTER — Other Ambulatory Visit (HOSPITAL_COMMUNITY)
Admission: RE | Admit: 2023-07-16 | Discharge: 2023-07-16 | Disposition: A | Discharge status: Home / Self Care | Payer: BC Managed Care – PPO | Source: Ambulatory Visit | Attending: Obstetrics & Gynecology | Admitting: Obstetrics & Gynecology

## 2023-07-16 VITALS — BP 144/85 | HR 67 | Resp 16 | Ht 65.0 in | Wt 157.0 lb

## 2023-07-16 DIAGNOSIS — R87611 Atypical squamous cells cannot exclude high grade squamous intraepithelial lesion on cytologic smear of cervix (ASC-H): Secondary | ICD-10-CM | POA: Insufficient documentation

## 2023-07-16 NOTE — Progress Notes (Signed)
Colposcopy Procedure Note  Indications: Pap smear 1 months ago showed: ASC cannot exclude high grade lesion [facility]). The prior pap showed no abnormalities.  Prior cervical/vaginal disease: normal exam without visible pathology. Prior cervical treatment: no treatment.  Procedure Details  The risks and benefits of the procedure and Written informed consent obtained.  Speculum placed in vagina and excellent visualization of cervix achieved, cervix swabbed x 3 with acetic acid solution.  Findings: Cervix: acetowhite lesion(s) noted at 12 o'clock; cervix swabbed with Lugol's solution, SCJ visualized - lesion at 12  o'clock, endocervical curettage performed, cervical biopsies taken at 12 o'clock, and specimen labelled and sent to pathology. Vaginal inspection: vaginal colposcopy not performed. Vulvar colposcopy: vulvar colposcopy not performed.  Specimens: ECC, 12 o'clock biopsy  Complications: none.  Plan: Specimens labelled and sent to Pathology. Will base further treatment on Pathology findings. Post biopsy instructions given to patient.

## 2023-07-23 LAB — SURGICAL PATHOLOGY

## 2023-08-08 ENCOUNTER — Encounter: Payer: Self-pay | Admitting: Obstetrics & Gynecology

## 2023-08-08 DIAGNOSIS — N870 Mild cervical dysplasia: Secondary | ICD-10-CM | POA: Insufficient documentation

## 2023-10-25 ENCOUNTER — Encounter: Payer: Self-pay | Admitting: Obstetrics & Gynecology

## 2023-10-25 DIAGNOSIS — F419 Anxiety disorder, unspecified: Secondary | ICD-10-CM

## 2023-10-31 ENCOUNTER — Other Ambulatory Visit: Payer: Self-pay

## 2023-10-31 DIAGNOSIS — Z803 Family history of malignant neoplasm of breast: Secondary | ICD-10-CM

## 2023-11-01 MED ORDER — ALPRAZOLAM 0.5 MG PO TABS: ORAL_TABLET | ORAL | 0 refills | Status: DC

## 2023-11-12 ENCOUNTER — Other Ambulatory Visit: Payer: Self-pay | Admitting: Obstetrics and Gynecology

## 2023-11-12 DIAGNOSIS — F419 Anxiety disorder, unspecified: Secondary | ICD-10-CM

## 2023-11-12 MED ORDER — ALPRAZOLAM 0.5 MG PO TABS: ORAL_TABLET | ORAL | 0 refills | Status: DC

## 2023-11-12 NOTE — Addendum Note (Signed)
Addended by: Milas Hock A on: 11/12/2023 09:46 AM   Modules accepted: Orders

## 2023-12-02 ENCOUNTER — Ambulatory Visit
Admission: RE | Admit: 2023-12-02 | Discharge: 2023-12-02 | Disposition: A | Discharge status: Home / Self Care | Payer: BC Managed Care – PPO | Source: Ambulatory Visit | Attending: Obstetrics & Gynecology | Admitting: Obstetrics & Gynecology

## 2023-12-02 DIAGNOSIS — Z1239 Encounter for other screening for malignant neoplasm of breast: Secondary | ICD-10-CM | POA: Diagnosis not present

## 2023-12-02 DIAGNOSIS — Z803 Family history of malignant neoplasm of breast: Secondary | ICD-10-CM

## 2023-12-02 MED ORDER — GADOPICLENOL 0.5 MMOL/ML IV SOLN
7.0000 mL | Freq: Once | INTRAVENOUS | Status: AC | PRN
Start: 2023-12-02 — End: 2023-12-02
  Administered 2023-12-02: 7 mL via INTRAVENOUS

## 2023-12-03 ENCOUNTER — Encounter: Payer: Self-pay | Admitting: Obstetrics & Gynecology

## 2024-04-16 ENCOUNTER — Telehealth: Payer: Self-pay | Admitting: *Deleted

## 2024-04-16 ENCOUNTER — Other Ambulatory Visit: Payer: Self-pay | Admitting: Obstetrics & Gynecology

## 2024-04-16 DIAGNOSIS — Z0000 Encounter for general adult medical examination without abnormal findings: Secondary | ICD-10-CM

## 2024-04-16 NOTE — Telephone Encounter (Signed)
 Returned call from 4:22 PM. Left patient a message that annual is not due until after 06/24/2024. Office will contact patient when schedule is available.

## 2024-04-21 ENCOUNTER — Encounter: Payer: Self-pay | Admitting: Family Medicine

## 2024-04-21 ENCOUNTER — Ambulatory Visit: Admitting: Family Medicine

## 2024-04-21 ENCOUNTER — Other Ambulatory Visit: Payer: Self-pay | Admitting: Family Medicine

## 2024-04-21 VITALS — BP 136/64 | HR 78 | Ht 65.0 in | Wt 150.0 lb

## 2024-04-21 DIAGNOSIS — E010 Iodine-deficiency related diffuse (endemic) goiter: Secondary | ICD-10-CM

## 2024-04-21 DIAGNOSIS — F4329 Adjustment disorder with other symptoms: Secondary | ICD-10-CM | POA: Insufficient documentation

## 2024-04-21 DIAGNOSIS — Z0000 Encounter for general adult medical examination without abnormal findings: Secondary | ICD-10-CM

## 2024-04-21 DIAGNOSIS — R590 Localized enlarged lymph nodes: Secondary | ICD-10-CM | POA: Diagnosis not present

## 2024-04-21 MED ORDER — CITALOPRAM HYDROBROMIDE 20 MG PO TABS: ORAL_TABLET | ORAL | 1 refills | Status: DC

## 2024-04-21 NOTE — Progress Notes (Signed)
 Acute Office Visit  Subjective:     Patient ID: Ruth Garcia, female    DOB: 1971-07-19, 53 y.o.   MRN: [id number]  Chief Complaint  Patient presents with   Anxiety    HPI Patient is in today for anxiety. Had a cousin who passed away recently. Had to help her mom who had hip replacement.  She felt like these things were triggering her anxiety.  She is not sleeping well.  Dtr just graduated from college. Just feeling some increased emotions and anxiety.  A lot of her anxiety started around 7 yr ago when her sister was dx with cancer.     She stopped celexa  8-9 months.  She is not talking to anyone right now.   She has felt down and depressed more than half of the days feeling nervous and anxious nearly every day feeling like she cannot control the worry.  No thoughts of harming herself difficulty with concentration and sleep quality.  She just gets to the point where she just feels tearful and overwhelmed at times.  Also noticed a swollen lymph node on the back of her neck on the left side at least for a couple of months.  No recent changes.  ROS      Objective:    BP 136/64   Pulse 78   Ht 5' 5 (1.651 m)   Wt 150 lb 0.6 oz (68.1 kg)   SpO2 100%   BMI 24.97 kg/m    Physical Exam Vitals and nursing note reviewed.  Constitutional:      Appearance: Normal appearance.  HENT:     Head: Normocephalic and atraumatic.  Eyes:     Conjunctiva/sclera: Conjunctivae normal.  Neck:     Comments: She has a palpable slightly smaller than 1 cm lymph node on the back of the left side of the neck below the ear.  I do not palpate any other lymph nodes that are swollen.  It is not matted or hard.  Also feel like the right side of her thyroid  gland is just a little bit larger than the left side like to evaluate with ultrasound just to rule out a nodule versus just some asymmetry of the gland. Cardiovascular:     Rate and Rhythm: Normal rate and regular rhythm.  Pulmonary:     Effort:  Pulmonary effort is normal.     Breath sounds: Normal breath sounds.  Musculoskeletal:     Cervical back: Neck supple. No tenderness.  Lymphadenopathy:     Cervical: Cervical adenopathy present.  Skin:    General: Skin is warm and dry.  Neurological:     Mental Status: She is alert.  Psychiatric:        Mood and Affect: Mood normal.     No results found for any visits on 04/21/24.      Assessment & Plan:   Problem List Items Addressed This Visit       Other   Stress and adjustment reaction - Primary   We did discuss options.  I really think she would benefit from talking to somebody since her anxiety levels have been really high in particular over the last couple of weeks.  We also discussed the possibility of restarting an SSRI she is okay with restarting citalopram  since she previously took that medication and did well with it so I think that is perfectly reasonable.  Follow-up at the end of the month      Relevant Medications  citalopram  (CELEXA ) 20 MG tablet   Other Relevant Orders   Ambulatory referral to Behavioral Health   Other Visit Diagnoses       Enlarged lymph node in neck       Relevant Orders   US  THYROID      Thyromegaly       Relevant Orders   US  THYROID       Evaluate lymph node and asymmetry to thyroid  gland with ultrasound for further workup she actually has a physical and labs to at the end of the month so in about 3 weeks so we will get a CBC at that time.  Meds ordered this encounter  Medications   citalopram  (CELEXA ) 20 MG tablet    Sig: Take 0.5 tablets (10 mg total) by mouth daily for 10 days, THEN 1 tablet (20 mg total) daily for 20 days.    Dispense:  90 tablet    Refill:  1    No follow-ups on file.  Dorothyann Byars, MD

## 2024-04-21 NOTE — Progress Notes (Signed)
Orders Placed This Encounter  Procedures   CMP14+EGFR   Lipid panel   CBC   TSH

## 2024-04-21 NOTE — Assessment & Plan Note (Signed)
 We did discuss options.  I really think she would benefit from talking to somebody since her anxiety levels have been really high in particular over the last couple of weeks.  We also discussed the possibility of restarting an SSRI she is okay with restarting citalopram  since she previously took that medication and did well with it so I think that is perfectly reasonable.  Follow-up at the end of the month

## 2024-04-24 ENCOUNTER — Other Ambulatory Visit: Payer: Self-pay | Admitting: Family Medicine

## 2024-04-24 DIAGNOSIS — R590 Localized enlarged lymph nodes: Secondary | ICD-10-CM

## 2024-04-28 ENCOUNTER — Encounter: Payer: Self-pay | Admitting: Professional

## 2024-04-28 ENCOUNTER — Telehealth: Payer: Self-pay

## 2024-04-28 ENCOUNTER — Ambulatory Visit (INDEPENDENT_AMBULATORY_CARE_PROVIDER_SITE_OTHER): Admitting: Professional

## 2024-04-28 ENCOUNTER — Encounter: Payer: Self-pay | Admitting: Family Medicine

## 2024-04-28 DIAGNOSIS — F418 Other specified anxiety disorders: Secondary | ICD-10-CM | POA: Diagnosis not present

## 2024-04-28 NOTE — Telephone Encounter (Signed)
 Copied from CRM [id number]. Topic: General - Other >> Apr 28, 2024  8:57 AM Miquel SAILOR wrote: Reason for CRM: Patient needed info on Lab info . Gave inf it is fasting and lab orders are in. Walk in only lunch from 12-1pm

## 2024-04-28 NOTE — Progress Notes (Signed)
   Ruth Garcia, [facility]

## 2024-04-28 NOTE — Telephone Encounter (Signed)
 Spoke with patient and informed  of correct hours of lab appt.

## 2024-04-28 NOTE — Progress Notes (Signed)
 [facility] Counselor Initial Adult Exam  Name: Ruth Garcia Date: 04/28/2024 MRN: [id number] DOB: August 06, 1971 PCP: Alvan Dorothyann BIRCH, MD  Time spent: 64 minutes 1101am-1205pm  Guardian/Payee:  self    Paperwork requested: Yes   Reason for Visit Ruth Garcia Problem: This session was held via video teletherapy The patient consented to video teletherapy and was located at her home during this session. She is aware it is the responsibility of the patient to secure confidentiality on her end of the session. The provider was in a private home office for the duration of this session.    The patient arrived on time for her Caregility session.  The patient reports she has a lot of anxiety and faer and it goes back. She doesn't recall being anxious when she was young. She always goes to worse case scenario and has triggers. She recently began citalopram  after a 10 month hiatus. She felt emotional and her kids are getting older and she had a lot of cancer in her family and that just terrifies me Her cousin just died form it. She was bracha tested and she does not have nay mutations but does have mutations in her family and her cousin has bracha 2. Her first anxiety began in early 2003 when she knew something was strong with her son and she thought he was on the spectrum.  Her sister was diagnosed in 2017 with breast cancer and went in to remission. They had her five year celebration and now has been diagnosed with a very aggressive uterine cancer. She is now two years cancer free.  Her cousin was dx with pancreatic cancer at 48 and just died several months ago. His sister is the bracha 2 positive and has had breast and tyroid.  Has a sonogram tomorrow due to thyroid  and a lump on neck  Mental Status Exam: Appearance: Casual    Behavior: Sharing Motor: Normal Speech/Language: Clear and Coherent and Normal Rate Affect: Congruent Mood: anxious Thought process: goal  directed Thought content: WNL Sensory/Perceptual disturbances: WNL Orientation: oriented to person, place, time/date, and situation Attention: Good Concentration: Good Memory: WNL Fund of knowledge: Good Insight: Good Judgment: Good Impulse Control: Good  Risk Assessment: Danger to Self:  No Self-injurious Behavior: No Danger to Others: No Duty to Warn:no Physical Aggression / Violence:No  Access to Firearms a concern: No  Gang Involvement:No  Patient / guardian was educated about steps to take if suicide or homicide risk level increases between visits: no While future psychiatric events cannot be accurately predicted, the patient does not currently require acute inpatient psychiatric care and does not currently meet [facility]  involuntary commitment criteria.  Substance Abuse History: Current substance abuse: Yes  occasional drink of a beer or glass of wine, every Thursday with my girlfriends after work. She has no history of substance use, no prescriptions medication abuse, no over exercise, no overspending, no over shopping.  Past Psychiatric History:   No previous psychological problems have been observed Outpatient Providers:7-8 years ago had counseling History of Psych Hospitalization: No  Psychological Testing: none   Abuse History:  Victim of: No.,  Report needed: No. Victim of Neglect:No. Perpetrator of none  Witness / Exposure to Domestic Violence: No   Protective Services Involvement: No  Witness to [employer] Violence:  No   Family History:  Family History  Problem Relation Age of Onset   Esophageal cancer Paternal Grandfather    Thyroid  disease Mother    Breast cancer Sister  Endometrial cancer Sister    Uterine cancer Sister    Thyroid  disease Daughter    Cancer Maternal Aunt        2 with breast and one with ovarian   Breast cancer Maternal Aunt        x2   Cancer Maternal Uncle        colon   Colon cancer Maternal Uncle    Colon polyps Neg  Hx    Rectal cancer Neg Hx    Stomach cancer Neg Hx    Living situation: the patient lives with their family  Sexual Orientation: Straight  Relationship Status: married  Name of spouse / other: [name], married 30 years after dating 4 years. She descries the relationship as supportive, it's very good, he doesn't understand why he can't get the thoughts out of her head. 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 Relationship with children is good. She has a great relationship with her daughter but her son is a little different and has isuses connecting with him.  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 Pt feels guilty asking for help because she doesn't want to appear stupid.  Any cultural differences that may affect / interfere with treatment:  not applicable   Recreation/Hobbies: going out with work friends, beach trip with girlfriends, dinner with family on Saturdays, yoga, walk, hanging out with her dog  Stressors: Health problems   Loss of cousin, fear of dying due to spirituality   Traumatic event    Strengths: Supportive Relationships, Family, Friends, Spirituality, and Able to Communicate Effectively  Barriers:  none   Legal History: Pending legal issue / charges: The patient has no significant  history of legal issues. History of legal issue / charges: none  Medical History/Surgical History: reviewed Past Medical History:  Diagnosis Date   Allergy    Anxiety    Depression     Past Surgical History:  Procedure Laterality Date   BREAST BIOPSY Bilateral 04/2017   benign   dental implants     URETHRAL CYST REMOVAL     WISDOM TOOTH EXTRACTION      Medications: Current Outpatient Medications  Medication Sig Dispense Refill   Cholecalciferol 2000 units CAPS Take 1 capsule by mouth daily.      citalopram  (CELEXA ) 20 MG tablet Take 0.5 tablets (10 mg total) by mouth daily for 10 days, THEN 1 tablet (20 mg total) daily for 20 days. 90 tablet 1   fish oil-omega-3 fatty acids 1000 MG capsule Take 2 g by mouth daily. Omega Woman %00 mg Omega 3 76 mg GLA  Daily     OVER THE COUNTER MEDICATION Vital Proteins Collagen Peptides 1 scoop daily     OVER THE COUNTER MEDICATION Vitafusions Womens Multi 2 gummies daily     Selenium 200 MCG CAPS Take by mouth.  TURMERIC CURCUMIN PO Take by mouth. 1000 mg daily     vitamin C (ASCORBIC ACID) 500 MG tablet Take 500 mg by mouth daily.     vitamin E 400 UNIT capsule Take 400 Units by mouth daily.     No current facility-administered medications for this visit.    No Known Allergies  Diagnoses:  Depression with anxiety  Plan of Care:  -meet again on Tuesday, May 13, 2024 at 9am.

## 2024-04-29 ENCOUNTER — Ambulatory Visit

## 2024-04-29 DIAGNOSIS — R590 Localized enlarged lymph nodes: Secondary | ICD-10-CM | POA: Diagnosis not present

## 2024-04-29 DIAGNOSIS — E010 Iodine-deficiency related diffuse (endemic) goiter: Secondary | ICD-10-CM

## 2024-04-29 DIAGNOSIS — R221 Localized swelling, mass and lump, neck: Secondary | ICD-10-CM | POA: Diagnosis not present

## 2024-04-29 DIAGNOSIS — Z0000 Encounter for general adult medical examination without abnormal findings: Secondary | ICD-10-CM | POA: Diagnosis not present

## 2024-04-30 ENCOUNTER — Ambulatory Visit: Payer: Self-pay | Admitting: Family Medicine

## 2024-04-30 LAB — LIPID PANEL
Chol/HDL Ratio: 3.2 ratio (ref 0.0–4.4)
Cholesterol, Total: 180 mg/dL (ref 100–199)
HDL: 56 mg/dL (ref >39)
LDL Chol Calc (NIH): 114 mg/dL — ABNORMAL HIGH (ref 0–99)
Triglycerides: 53 mg/dL (ref 0–149)
VLDL Cholesterol Cal: 10 mg/dL (ref 5–40)

## 2024-04-30 LAB — CMP14+EGFR
ALT: 22 IU/L (ref 0–32)
AST: 15 IU/L (ref 0–40)
Albumin: 4.5 g/dL (ref 3.8–4.9)
Alkaline Phosphatase: 64 IU/L (ref 44–121)
BUN/Creatinine Ratio: 14 (ref 9–23)
BUN: 11 mg/dL (ref 6–24)
Bilirubin Total: 0.7 mg/dL (ref 0.0–1.2)
CO2: 21 mmol/L (ref 20–29)
Calcium: 9.3 mg/dL (ref 8.7–10.2)
Chloride: 99 mmol/L (ref 96–106)
Creatinine, Ser: 0.76 mg/dL (ref 0.57–1.00)
Globulin, Total: 2.5 g/dL (ref 1.5–4.5)
Glucose: 107 mg/dL — ABNORMAL HIGH (ref 70–99)
Potassium: 4.4 mmol/L (ref 3.5–5.2)
Sodium: 137 mmol/L (ref 134–144)
Total Protein: 7 g/dL (ref 6.0–8.5)
eGFR: 93 mL/min/1.73 (ref >59)

## 2024-04-30 LAB — CBC
Hematocrit: 45.1 % (ref 34.0–46.6)
Hemoglobin: 14.7 g/dL (ref 11.1–15.9)
MCH: 30.8 pg (ref 26.6–33.0)
MCHC: 32.6 g/dL (ref 31.5–35.7)
MCV: 94 fL (ref 79–97)
Platelets: 270 x10E3/uL (ref 150–450)
RBC: 4.78 x10E6/uL (ref 3.77–5.28)
RDW: 11.7 % (ref 11.7–15.4)
WBC: 5 x10E3/uL (ref 3.4–10.8)

## 2024-04-30 LAB — TSH: TSH: 1.95 u[IU]/mL (ref 0.450–4.500)

## 2024-04-30 NOTE — Progress Notes (Signed)
 Hi Ruth Garcia, ultrasound shows that the thyroid  overall looks okay.  No suspicious nodules noted.  The right side of your gland is just slightly larger than the left but nothing worrisome.  They did note some benign-appearing lymph nodes.  The largest measured about 2.2 cm on the right side and the largest one on the left was around 2.3 cm on the left.  They did not have any unusual features such as calcifications or anything that looked necrotic in the thyroid  then then the lymph nodes.  Would just keep an eye on the lymph nodes if you feel like they are changing or getting larger over time let me know.  Or if they become painful or tender or sore.

## 2024-05-01 ENCOUNTER — Ambulatory Visit: Payer: Self-pay | Admitting: Family Medicine

## 2024-05-01 NOTE — Progress Notes (Signed)
 Hi [name], metabolic panel overall looks good LDL cholesterol still little elevated but it is much better than it was last year so great work!  Blood count is normal which is very reassuring in regards to the lymph nodes and the thyroid  level looks perfect.

## 2024-05-13 ENCOUNTER — Encounter: Payer: Self-pay | Admitting: Professional

## 2024-05-13 ENCOUNTER — Ambulatory Visit: Admitting: Professional

## 2024-05-13 DIAGNOSIS — F411 Generalized anxiety disorder: Secondary | ICD-10-CM | POA: Diagnosis not present

## 2024-05-13 DIAGNOSIS — F418 Other specified anxiety disorders: Secondary | ICD-10-CM

## 2024-05-13 NOTE — Progress Notes (Addendum)
 [facility] Counselor/Therapist Progress Note  Patient ID: Ruth Garcia, MRN: [id number],    Date: 05/13/2024  Time Spent: 54 minutes [id number]   Treatment Type: Individual Therapy  Risk Assessment: Danger to Self:  No Self-injurious Behavior: No Danger to Others: No  Subjective: This session was held via video teletherapy The patient consented to video teletherapy and was located at her home during this session. She is aware it is the responsibility of the patient to secure confidentiality on her end of the session. The provider was in a private home office for the duration of this session.    The patient arrived on time for her Caregility session.  1-treatment planning -I wanna learn how to manage this (anxiety) -Where did it all come from -I want to be mor positive too, I always see things from a negative perspective -I can't control everything and I don't know how to be okay with that  Treatment Plan Problems: Anxiety, Childhood Trauma, Low Self-Esteem, Spiritual Confusion Symptoms: Verbalization of a desire for a closer relationship to a higher power. Feelings and attitudes about a higher power that are characterized by fear, anger, and distrust. A felt need for a higher power, but because upbringing contained no religious education or training, does not know where or how to begin. Difficulty in saying no to others; assumes not being liked by others. Fear of rejection by others, especially peer group. Description of parents as physically or emotionally neglectful as they were chemically dependent, too busy, absent, etc. Irrational fears, suppressed rage, low self-esteem, identity conflicts, depression, or anxious insecurity related to painful early life experiences. Excessive and/or unrealistic worry that is difficult to control occurring more days than not for at least 6 months about a number of events or activities. Motor tension (e.g., restlessness,  tiredness, shakiness, muscle tension). Autonomic hyperactivity (e.g., palpitations, shortness of breath, dry mouth, trouble swallowing, nausea, diarrhea). Hypervigilance (e.g., feeling constantly on edge, experiencing concentration difficulties, having trouble falling or staying asleep, exhibiting a general state of irritability). Goals: Reduce overall frequency, intensity, and duration of the anxiety so that daily functioning is not impaired. Stabilize anxiety level while increasing ability to function on a daily basis. Resolve the core conflict that is the source of anxiety. Enhance ability to effectively cope with the full variety of life's worries and anxieties. Learn and implement coping skills that result in a reduction of anxiety and worry, and improved daily functioning. Resolve past childhood/family issues, leading to less anger and depression, greater self-esteem, security, and confidence. Release the emotions associated with past childhood/family issues, resulting in less resentment and more serenity. Let go of blame and begin to forgive others for pain caused in childhood. Elevate self-esteem. Develop a consistent, positive self-image. Establish an inward sense of self-worth, confidence, and competence. Demonstrate improved self-esteem through more pride in appearance, more assertiveness, greater eye contact, and identification of positive traits in self-talk messages. Clarify spiritual concepts and instill a freedom to approach a higher power as a resource for support. Increase belief in and development of a relationship with a higher power. Begin a faith in a higher power and incorporate it into support system. Resolve issues that have prevented faith or belief from developing and growing. Objectives target date for all objectives is 05/13/2025: Verbalize an understanding of the cognitive, physiological, and behavioral components of anxiety and its treatment. Learn and implement  calming skills to reduce overall anxiety and manage anxiety symptoms. Verbalize an understanding of the role that cognitive biases  play in excessive irrational worry and persistent anxiety symptoms. Identify, challenge, and replace biased, fearful self-talk with positive, realistic, and empowering self-talk. Learn and implement relapse prevention strategies for managing possible future anxiety symptoms. Learn to accept limitations in life and commit to tolerating, rather than avoiding, unpleasant emotions while accomplishing meaningful goals. Describe each family member and identify the role each played within the family. Identify patterns of abuse, neglect, or abandonment within the family of origin, both current and historical, nuclear and extended. Identify feelings associated with major traumatic incidents in childhood and with parental child-rearing patterns. Acknowledge feeling less competent than most others. Decrease the frequency of negative self-descriptive statements and increase frequency of positive self-descriptive statements. Identify and engage in activities that would improve self-image by being consistent with one's values. Identify positive traits and talents about self. Articulate a plan to be proactive in trying to get identified needs met. Increase the frequency of assertive behaviors. Summarize the highlights of own spiritual quest or journey to this date. Describe early life training in spiritual concepts and identify its impact on current religious beliefs. Identify the difference between religion and faith. Implement daily attempts to be in contact with higher power. Acknowledge the need to separate negative past experiences with religious people from the current spiritual evaluation. Ask a respected person who has apparent spiritual depth to serve as a Dance movement psychotherapist. Attend groups dedicated to enriching spirituality. Read books that focus on furthering a connection with a  higher power. Interventions: Explore the client's assessment of himself/herself and what is verbalized as the basis for negative self-perception. Help the client analyze his/her values and the congruence or incongruence between them and the client's daily activities. Identify and assign activities congruent with the client's values; process them toward improving self-concept and self-esteem. Assign the client the exercise of identifying his/her positive physical characteristics in a mirror to help him/her become more comfortable with himself/herself. Ask the client to keep building a list of positive traits and have him/her read the list at the beginning and end of each session (or assign Acknowledging My Strengths or What Are My Good Qualities? in the Adult Psychotherapy Homework Planner by [facility]); reinforce the client's positive self-descriptive statements. Assist the client in identifying and verbalizing his/her needs, met and unmet. Assist the client in developing a specific action plan to get each need met (or assign Satisfying Unmet Emotional Needs in the Adult Psychotherapy Homework Planner by Jenniffer). Train the client in assertiveness or refer him/her to a group that will educate and facilitate assertiveness skills via lectures and assignments. Assist the client in becoming aware of how he/she expresses or acts out negative feelings about himself/herself. Help the client reframe his/her negative assessment of himself/herself. Ask the client to talk about or write the story of his/her spiritual quest/journey (or assign My History of Spirituality from the Adult Psychotherapy Homework Planner by Jenniffer); process the journey material. Assist the client in evaluating religious tenets separated from painful emotional experiences with religious people in his/her past. Explore the religious distortions and judgmentalism that the client has been subjected to by others. Help the client find  a mentor to guide his/her spiritual development. Make the client aware of opportunities for spiritual enrichment (e.g., Bible studies, study groups, fellowship groups); process the experiences he/she decides to pursue. Suggest that the client attend a spiritual retreat (e.g., [place] or [employer]) and report to therapist what the experience was like for him/her and what he/she gained from the experience. Ask the client  to read books to cultivate his/her spirituality (e.g., The Cloister Walk by [employer]; The Purpose-driven Life by Butler; The Care of the Soul by Georgina). Review the client's early life experiences surrounding belief in a higher power and explore how this affects current beliefs. Educate the client on the difference between religion and spirituality. Recommend that the client implement daily meditations and/or prayer; process the experience. Assign the client to write a daily note to his/her higher power. Encourage and assist the client in developing and implementing a daily devotional time or other ritual that will foster his/her spiritual growth. Explore the client's schema and self-talk that mediate his/her fear response; assist him/her in challenging the biases; replace the distorted messages with reality-based alternatives and positive, realistic self-talk that will increase his/her self-confidence in coping with irrational fears (see Cognitive Therapy of Anxiety Disorders by Gretta armin Mon). Discuss with the client the distinction between a lapse and relapse, associating a lapse with an initial and reversible return of worry, anxiety symptoms, or urges to avoid, and relapse with the decision to continue the fearful and avoidant patterns. Identify and rehearse with the client the management of future situations or circumstances in which lapses could occur. Develop a coping card on which coping strategies and other important information (e.g., Breathe deeply and relax,  Challenge unrealistic worries, Use problem-solving) are written for the client's later use. Use techniques from Acceptance and Commitment Therapy to help client accept uncomfortable realities such as lack of complete control, imperfections, and uncertainty and tolerate unpleasant emotions and thoughts in order to accomplish value-consistent goals. Discuss how generalized anxiety typically involves excessive worry about unrealistic threats, various bodily expressions of tension, overarousal, and hypervigilance, and avoidance of what is threatening that interact to maintain the problem (see Mastery of Your Anxiety and Worry: Therapist Guide by Venson River, and Barlow; Treating Generalized Anxiety Disorder by Rygh and Red). Assign the client to read about progressive muscle relaxation and other calming strategies in relevant books or treatment manuals (e.g., Progressive Relaxation Training by Thornell and Elmer; Mastery of Your Anxiety and Worry: Workbook by River armin Given). Discuss examples demonstrating that unrealistic worry typically overestimates the probability of threats and underestimates or overlooks the client's ability to manage realistic demands (or assign Past Successful Anxiety Coping in the Adult Psychotherapy Homework Planner by Jenniffer). Teach the client calming/relaxation skills (e.g., applied relaxation, progressive muscle relaxation, cue controlled relaxation; mindful breathing; biofeedback) and how to discriminate better between relaxation and tension; teach the client how to apply these skills to his/her daily life (e.g., New Directions in Progressive Muscle Relaxation by Thornell Elmer, and Hazlett-Stevens; Treating Generalized Anxiety Disorder by Rygh and Red). Assist the client in analyzing his/her worries by examining potential biases such as the probability of the negative expectation occurring, the real consequences of it occurring, his/her ability to  control the outcome, the worst possible outcome, and his/her ability to accept it (see Analyze the Probability of a Feared Event in the Adult Psychotherapy Homework Planner by Jenniffer; Cognitive Therapy of Anxiety Disorders by Gretta armin Mon). Assist the client in clarifying his/her role within the family and his/her feelings connected to that role. Assign the client to ask parents about their family backgrounds and develop insight regarding patterns of behavior and causes for parents' dysfunction. Explore the client's painful childhood experiences (or assign Share the Painful Memory in the Adult Psychotherapy Homework Planner by Jenniffer). Support and encourage the client when he/she begins to express feelings of rage, sadness, fear, and rejection  relating to family abuse or neglect. Assign the client to record feelings in a journal that describes memories, behavior, and emotions tied to his/her traumatic childhood experiences (or assign How the Trauma Affects Me in the Adult Psychotherapy Homework Planner by Jenniffer).  Diagnosis:Depression with anxiety  Generalized anxiety disorder  Plan:  -meet again on Thursday, May 22, 2024 at 4pm in [place]

## 2024-05-13 NOTE — Progress Notes (Signed)
   Ruth Garcia, [facility]

## 2024-05-14 ENCOUNTER — Ambulatory Visit (INDEPENDENT_AMBULATORY_CARE_PROVIDER_SITE_OTHER): Payer: BC Managed Care – PPO | Admitting: Family Medicine

## 2024-05-14 ENCOUNTER — Encounter: Payer: Self-pay | Admitting: Family Medicine

## 2024-05-14 VITALS — BP 125/55 | HR 55 | Ht 65.0 in | Wt 147.0 lb

## 2024-05-14 DIAGNOSIS — R7309 Other abnormal glucose: Secondary | ICD-10-CM | POA: Diagnosis not present

## 2024-05-14 DIAGNOSIS — F418 Other specified anxiety disorders: Secondary | ICD-10-CM | POA: Diagnosis not present

## 2024-05-14 DIAGNOSIS — Z0000 Encounter for general adult medical examination without abnormal findings: Secondary | ICD-10-CM

## 2024-05-14 DIAGNOSIS — J392 Other diseases of pharynx: Secondary | ICD-10-CM | POA: Diagnosis not present

## 2024-05-14 LAB — POCT GLYCOSYLATED HEMOGLOBIN (HGB A1C): Hemoglobin A1C: 5.2 % (ref 4.0–5.6)

## 2024-05-14 NOTE — Assessment & Plan Note (Signed)
 Flowsheet Row Office Visit from 05/14/2024 in [facility] Primary Care & Sports Medicine at [facility]  PHQ-9 Total Score 15      05/14/2024    9:13 AM 04/21/2024    3:07 PM 05/06/2021    1:53 PM 09/08/2020    8:23 AM  GAD 7 : Generalized Anxiety Score  Nervous, Anxious, on Edge 3 3 1 1   Control/stop worrying 2 3 1 1   Worry too much - different things 2 3 1 1   Trouble relaxing 2 3 0 1  Restless 2 2 0 1  Easily annoyed or irritable 2 2 1 1   Afraid - awful might happen 2 3 1  0  Total GAD 7 Score 15 19 5 6   Anxiety Difficulty Somewhat difficult Somewhat difficult Not difficult at all    She just went up to 20 mg of citalopram  which was the dose that she had taken previously.  So far she is doing well and she does have 1 refill plan was to see her back in 8 weeks to make sure that she is doing well and improving and reaching therapeutic goal she did start engaging with therapy with Nathanel and we will see her in person next week.

## 2024-05-14 NOTE — Progress Notes (Addendum)
 Complete physical exam  Patient: Ruth Garcia   DOB: Jan 24, 1971   53 y.o. Female  MRN: [id number]  Subjective:    Chief Complaint  Patient presents with   Annual Exam    Labs done prior,mammogram scheduled 06/02/24    Ruth Garcia is a 53 y.o. female who presents today for a complete physical exam. She reports consuming a general diet. Exrecise - hot yoga She generally feels well. She reports sleeping fairly well. She does not have additional problems to discuss today.   She did go up to 20mg  on her citalopram  about 2 weeks ago and feels it is starting to help. Appetite is getting Better.     Most recent fall risk assessment:    04/21/2024    3:06 PM  Fall Risk   Falls in the past year? 0  Number falls in past yr: 0  Injury with Fall? 0  Risk for fall due to : No Fall Risks  Follow up Falls evaluation completed     Most recent depression screenings:    05/14/2024    9:12 AM 04/21/2024    3:06 PM  PHQ 2/9 Scores  PHQ - 2 Score 3 3  PHQ- 9 Score 15 14         Patient Care Team: Alvan Dorothyann BIRCH, MD as PCP - General (Family Medicine) [place], Andrez SAUNDERS, PA-C (Inactive) as Physician Assistant (Dermatology)   Outpatient Medications Prior to Visit  Medication Sig   Cholecalciferol 2000 units CAPS Take 1 capsule by mouth daily.    citalopram  (CELEXA ) 20 MG tablet Take 0.5 tablets (10 mg total) by mouth daily for 10 days, THEN 1 tablet (20 mg total) daily for 20 days.   fish oil-omega-3 fatty acids 1000 MG capsule Take 2 g by mouth daily. Omega Woman %00 mg Omega 3 76 mg GLA  Daily   OVER THE COUNTER MEDICATION Vital Proteins Collagen Peptides 1 scoop daily   OVER THE COUNTER MEDICATION Vitafusions Womens Multi 2 gummies daily   Selenium 200 MCG CAPS Take by mouth.    TURMERIC CURCUMIN PO Take by mouth. 1000 mg daily   vitamin C (ASCORBIC ACID) 500 MG tablet Take 500 mg by mouth daily.   vitamin E 400 UNIT capsule Take 400 Units by mouth daily.   No  facility-administered medications prior to visit.    ROS        Objective:     BP (!) 125/55   Pulse (!) 55   Ht 5' 5 (1.651 m)   Wt 147 lb (66.7 kg)   SpO2 99%   BMI 24.46 kg/m     Physical Exam Constitutional:      Appearance: Normal appearance.  HENT:     Head: Normocephalic and atraumatic.     Right Ear: Tympanic membrane, ear canal and external ear normal.     Left Ear: Tympanic membrane, ear canal and external ear normal.     Nose: Nose normal.     Mouth/Throat:     Pharynx: Oropharynx is clear.  Eyes:     Extraocular Movements: Extraocular movements intact.     Conjunctiva/sclera: Conjunctivae normal.     Pupils: Pupils are equal, round, and reactive to light.  Neck:     Thyroid : No thyromegaly.  Cardiovascular:     Rate and Rhythm: Normal rate and regular rhythm.  Pulmonary:     Effort: Pulmonary effort is normal.     Breath sounds: Normal breath sounds.  Abdominal:     General: Bowel sounds are normal.     Palpations: Abdomen is soft.     Tenderness: There is no abdominal tenderness.  Musculoskeletal:        General: No swelling.     Cervical back: Neck supple.  Skin:    General: Skin is warm and dry.  Neurological:     Mental Status: She is oriented to person, place, and time.  Psychiatric:        Mood and Affect: Mood normal.        Behavior: Behavior normal.      Results for orders placed or performed in visit on 05/14/24  POCT HgB A1C  Result Value Ref Range   Hemoglobin A1C 5.2 4.0 - 5.6 %   HbA1c POC (<> result, manual entry)     HbA1c, POC (prediabetic range)     HbA1c, POC (controlled diabetic range)          Assessment & Plan:    Routine Health Maintenance and Physical Exam  Immunization History  Administered Date(s) Administered   Influenza-Unspecified 09/16/2019   PFIZER(Purple Top)SARS-COV-2 Vaccination 12/12/2019, 01/09/2020   PPD Test 09/27/2012   Tdap 12/17/2013    Health Maintenance  Topic Date Due    Hepatitis B Vaccines (1 of 3 - 19+ 3-dose series) Never done   Zoster Vaccines- Shingrix (1 of 2) Never done   COVID-19 Vaccine (3 - 2024-25 season) 06/17/2023   DTaP/Tdap/Td (2 - Td or Tdap) 12/18/2023   INFLUENZA VACCINE  05/16/2024   MAMMOGRAM  12/01/2024   Cervical Cancer Screening (HPV/Pap Cotest)  06/24/2028   Colonoscopy  08/03/2030   Hepatitis C Screening  Completed   HIV Screening  Completed   HPV VACCINES  Aged Out   Meningococcal B Vaccine  Aged Out    Discussed health benefits of physical activity, and encouraged her to engage in regular exercise appropriate for her age and condition.  Problem List Items Addressed This Visit       Other   Depression with anxiety   Flowsheet Row Office Visit from 05/14/2024 in [facility] Primary Care & Sports Medicine at [facility]  PHQ-9 Total Score 15      05/14/2024    9:13 AM 04/21/2024    3:07 PM 05/06/2021    1:53 PM 09/08/2020    8:23 AM  GAD 7 : Generalized Anxiety Score  Nervous, Anxious, on Edge 3 3 1 1   Control/stop worrying 2 3 1 1   Worry too much - different things 2 3 1 1   Trouble relaxing 2 3 0 1  Restless 2 2 0 1  Easily annoyed or irritable 2 2 1 1   Afraid - awful might happen 2 3 1  0  Total GAD 7 Score 15 19 5 6   Anxiety Difficulty Somewhat difficult Somewhat difficult Not difficult at all    She just went up to 20 mg of citalopram  which was the dose that she had taken previously.  So far she is doing well and she does have 1 refill plan was to see her back in 8 weeks to make sure that she is doing well and improving and reaching therapeutic goal she did start engaging with therapy with Nathanel and we will see her in person next week.       Other Visit Diagnoses       Wellness examination    -  Primary     Abnormal glucose  Relevant Orders   POCT HgB A1C (Completed)     Throat irritation       Relevant Orders   Ambulatory referral to ENT       Keep up a regular exercise program and  make sure you are eating a healthy diet Reviewed labs together.   Declined Tdap todate.   Has f/u with gyn later this week.     No follow-ups on file.     Dorothyann Byars, MD

## 2024-05-14 NOTE — Addendum Note (Signed)
 Addended by: Rebeckah Masih D on: 05/14/2024 02:02 PM   Modules accepted: Orders

## 2024-05-22 ENCOUNTER — Encounter: Payer: Self-pay | Admitting: Professional

## 2024-05-22 ENCOUNTER — Ambulatory Visit: Admitting: Professional

## 2024-05-22 DIAGNOSIS — F411 Generalized anxiety disorder: Secondary | ICD-10-CM | POA: Diagnosis not present

## 2024-05-22 NOTE — Progress Notes (Addendum)
 [facility] Counselor/Therapist Progress Note  Patient ID: Ruth Garcia, MRN: [id number],    Date: 05/22/2024  Time Spent: 50 minutes [id number]  Treatment Type: Individual Therapy  Risk Assessment: Danger to Self:  No Self-injurious Behavior: No Danger to Others: No  Subjective: The patient arrived late for her in person appointment.  1-anxiety -history -impacts in life -triggers -how to manage  Treatment Plan Problems: Anxiety, Childhood Trauma, Low Self-Esteem, Spiritual Confusion Symptoms: Verbalization of a desire for a closer relationship to a higher power. Feelings and attitudes about a higher power that are characterized by fear, anger, and distrust. A felt need for a higher power, but because upbringing contained no religious education or training, does not know where or how to begin. Difficulty in saying no to others; assumes not being liked by others. Fear of rejection by others, especially peer group. Description of parents as physically or emotionally neglectful as they were chemically dependent, too busy, absent, etc. Irrational fears, suppressed rage, low self-esteem, identity conflicts, depression, or anxious insecurity related to painful early life experiences. Excessive and/or unrealistic worry that is difficult to control occurring more days than not for at least 6 months about a number of events or activities. Motor tension (e.g., restlessness, tiredness, shakiness, muscle tension). Autonomic hyperactivity (e.g., palpitations, shortness of breath, dry mouth, trouble swallowing, nausea, diarrhea). Hypervigilance (e.g., feeling constantly on edge, experiencing concentration difficulties, having trouble falling or staying asleep, exhibiting a general state of irritability). Goals: Reduce overall frequency, intensity, and duration of the anxiety so that daily functioning is not impaired. Stabilize anxiety level while increasing ability to function  on a daily basis. Resolve the core conflict that is the source of anxiety. Enhance ability to effectively cope with the full variety of life's worries and anxieties. Learn and implement coping skills that result in a reduction of anxiety and worry, and improved daily functioning. Resolve past childhood/family issues, leading to less anger and depression, greater self-esteem, security, and confidence. Release the emotions associated with past childhood/family issues, resulting in less resentment and more serenity. Let go of blame and begin to forgive others for pain caused in childhood. Elevate self-esteem. Develop a consistent, positive self-image. Establish an inward sense of self-worth, confidence, and competence. Demonstrate improved self-esteem through more pride in appearance, more assertiveness, greater eye contact, and identification of positive traits in self-talk messages. Clarify spiritual concepts and instill a freedom to approach a higher power as a resource for support. Increase belief in and development of a relationship with a higher power. Begin a faith in a higher power and incorporate it into support system. Resolve issues that have prevented faith or belief from developing and growing. Objectives target date for all objectives is 05/13/2025: Verbalize an understanding of the cognitive, physiological, and behavioral components of anxiety and its treatment. Learn and implement calming skills to reduce overall anxiety and manage anxiety symptoms. Verbalize an understanding of the role that cognitive biases play in excessive irrational worry and persistent anxiety symptoms. Identify, challenge, and replace biased, fearful self-talk with positive, realistic, and empowering self-talk. Learn and implement relapse prevention strategies for managing possible future anxiety symptoms. Learn to accept limitations in life and commit to tolerating, rather than avoiding, unpleasant emotions  while accomplishing meaningful goals. Describe each family member and identify the role each played within the family. Identify patterns of abuse, neglect, or abandonment within the family of origin, both current and historical, nuclear and extended. Identify feelings associated with major traumatic incidents in childhood and  with parental child-rearing patterns. Acknowledge feeling less competent than most others. Decrease the frequency of negative self-descriptive statements and increase frequency of positive self-descriptive statements. Identify and engage in activities that would improve self-image by being consistent with one's values. Identify positive traits and talents about self. Articulate a plan to be proactive in trying to get identified needs met. Increase the frequency of assertive behaviors. Summarize the highlights of own spiritual quest or journey to this date. Describe early life training in spiritual concepts and identify its impact on current religious beliefs. Identify the difference between religion and faith. Implement daily attempts to be in contact with higher power. Acknowledge the need to separate negative past experiences with religious people from the current spiritual evaluation. Ask a respected person who has apparent spiritual depth to serve as a Dance movement psychotherapist. Attend groups dedicated to enriching spirituality. Read books that focus on furthering a connection with a higher power. Interventions: Explore the client's assessment of himself/herself and what is verbalized as the basis for negative self-perception. Help the client analyze his/her values and the congruence or incongruence between them and the client's daily activities. Identify and assign activities congruent with the client's values; process them toward improving self-concept and self-esteem. Assign the client the exercise of identifying his/her positive physical characteristics in a mirror to help him/her  become more comfortable with himself/herself. Ask the client to keep building a list of positive traits and have him/her read the list at the beginning and end of each session (or assign Acknowledging My Strengths or What Are My Good Qualities? in the Adult Psychotherapy Homework Planner by [facility]); reinforce the client's positive self-descriptive statements. Assist the client in identifying and verbalizing his/her needs, met and unmet. Assist the client in developing a specific action plan to get each need met (or assign Satisfying Unmet Emotional Needs in the Adult Psychotherapy Homework Planner by Jenniffer). Train the client in assertiveness or refer him/her to a group that will educate and facilitate assertiveness skills via lectures and assignments. Assist the client in becoming aware of how he/she expresses or acts out negative feelings about himself/herself. Help the client reframe his/her negative assessment of himself/herself. Ask the client to talk about or write the story of his/her spiritual quest/journey (or assign My History of Spirituality from the Adult Psychotherapy Homework Planner by Jenniffer); process the journey material. Assist the client in evaluating religious tenets separated from painful emotional experiences with religious people in his/her past. Explore the religious distortions and judgmentalism that the client has been subjected to by others. Help the client find a mentor to guide his/her spiritual development. Make the client aware of opportunities for spiritual enrichment (e.g., Bible studies, study groups, fellowship groups); process the experiences he/she decides to pursue. Suggest that the client attend a spiritual retreat (e.g., [place] or [facility]) and report to therapist what the experience was like for him/her and what he/she gained from the experience. Ask the client to read books to cultivate his/her spirituality (e.g., The Cloister Walk by  Kay; The Purpose-driven Life by Butler; The Care of the Soul by Georgina). Review the client's early life experiences surrounding belief in a higher power and explore how this affects current beliefs. Educate the client on the difference between religion and spirituality. Recommend that the client implement daily meditations and/or prayer; process the experience. Assign the client to write a daily note to his/her higher power. Encourage and assist the client in developing and implementing a daily devotional time or other ritual that  will foster his/her spiritual growth. Explore the client's schema and self-talk that mediate his/her fear response; assist him/her in challenging the biases; replace the distorted messages with reality-based alternatives and positive, realistic self-talk that will increase his/her self-confidence in coping with irrational fears (see Cognitive Therapy of Anxiety Disorders by Gretta armin Mon). Discuss with the client the distinction between a lapse and relapse, associating a lapse with an initial and reversible return of worry, anxiety symptoms, or urges to avoid, and relapse with the decision to continue the fearful and avoidant patterns. Identify and rehearse with the client the management of future situations or circumstances in which lapses could occur. Develop a coping card on which coping strategies and other important information (e.g., Breathe deeply and relax, Challenge unrealistic worries, Use problem-solving) are written for the client's later use. Use techniques from Acceptance and Commitment Therapy to help client accept uncomfortable realities such as lack of complete control, imperfections, and uncertainty and tolerate unpleasant emotions and thoughts in order to accomplish value-consistent goals. Discuss how generalized anxiety typically involves excessive worry about unrealistic threats, various bodily expressions of tension, overarousal, and  hypervigilance, and avoidance of what is threatening that interact to maintain the problem (see Mastery of Your Anxiety and Worry: Therapist Guide by Venson River, and Barlow; Treating Generalized Anxiety Disorder by Rygh and Red). Assign the client to read about progressive muscle relaxation and other calming strategies in relevant books or treatment manuals (e.g., Progressive Relaxation Training by Thornell and Elmer; Mastery of Your Anxiety and Worry: Workbook by River armin Given). Discuss examples demonstrating that unrealistic worry typically overestimates the probability of threats and underestimates or overlooks the client's ability to manage realistic demands (or assign Past Successful Anxiety Coping in the Adult Psychotherapy Homework Planner by Jenniffer). Teach the client calming/relaxation skills (e.g., applied relaxation, progressive muscle relaxation, cue controlled relaxation; mindful breathing; biofeedback) and how to discriminate better between relaxation and tension; teach the client how to apply these skills to his/her daily life (e.g., New Directions in Progressive Muscle Relaxation by Thornell Elmer, and Hazlett-Stevens; Treating Generalized Anxiety Disorder by Rygh and Red). Assist the client in analyzing his/her worries by examining potential biases such as the probability of the negative expectation occurring, the real consequences of it occurring, his/her ability to control the outcome, the worst possible outcome, and his/her ability to accept it (see Analyze the Probability of a Feared Event in the Adult Psychotherapy Homework Planner by Jenniffer; Cognitive Therapy of Anxiety Disorders by Gretta armin Mon). Assist the client in clarifying his/her role within the family and his/her feelings connected to that role. Assign the client to ask parents about their family backgrounds and develop insight regarding patterns of behavior and causes for parents'  dysfunction. Explore the client's painful childhood experiences (or assign Share the Painful Memory in the Adult Psychotherapy Homework Planner by Jenniffer). Support and encourage the client when he/she begins to express feelings of rage, sadness, fear, and rejection relating to family abuse or neglect. Assign the client to record feelings in a journal that describes memories, behavior, and emotions tied to his/her traumatic childhood experiences (or assign How the Trauma Affects Me in the Adult Psychotherapy Homework Planner by Jenniffer).  Diagnosis:Generalized anxiety disorder  Plan:  -meet again on Monday, June 09, 2024 at [employer]

## 2024-05-27 DIAGNOSIS — K219 Gastro-esophageal reflux disease without esophagitis: Secondary | ICD-10-CM | POA: Diagnosis not present

## 2024-05-27 DIAGNOSIS — R0982 Postnasal drip: Secondary | ICD-10-CM | POA: Diagnosis not present

## 2024-05-27 DIAGNOSIS — J029 Acute pharyngitis, unspecified: Secondary | ICD-10-CM | POA: Diagnosis not present

## 2024-06-02 ENCOUNTER — Ambulatory Visit
Admission: RE | Admit: 2024-06-02 | Discharge: 2024-06-02 | Disposition: A | Discharge status: Home / Self Care | Source: Ambulatory Visit | Attending: Obstetrics & Gynecology | Admitting: Obstetrics & Gynecology

## 2024-06-02 DIAGNOSIS — Z1231 Encounter for screening mammogram for malignant neoplasm of breast: Secondary | ICD-10-CM | POA: Diagnosis not present

## 2024-06-02 DIAGNOSIS — Z0000 Encounter for general adult medical examination without abnormal findings: Secondary | ICD-10-CM

## 2024-06-04 ENCOUNTER — Ambulatory Visit: Payer: Self-pay | Admitting: Family Medicine

## 2024-06-04 NOTE — Progress Notes (Signed)
 Please call patient. Normal mammogram.  Repeat in 1 year.

## 2024-06-05 ENCOUNTER — Ambulatory Visit: Admitting: Professional

## 2024-06-09 ENCOUNTER — Ambulatory Visit: Admitting: Professional

## 2024-06-09 ENCOUNTER — Encounter: Payer: Self-pay | Admitting: Professional

## 2024-06-09 DIAGNOSIS — F418 Other specified anxiety disorders: Secondary | ICD-10-CM | POA: Diagnosis not present

## 2024-06-09 DIAGNOSIS — F411 Generalized anxiety disorder: Secondary | ICD-10-CM

## 2024-06-09 NOTE — Progress Notes (Signed)
 [facility] Counselor/Therapist Progress Note  Patient ID: Ruth Garcia, MRN: [id number],    Date: 06/09/2024  Time Spent: 54 minutes [id number]  Treatment Type: Individual Therapy  Risk Assessment: Danger to Self:  No Self-injurious Behavior: No Danger to Others: No  Subjective: The patient arrived late for her in person appointment.  1-anxiety -has had some improvements -has successfully got through ENT and mammograms 2-worry box -has helped -doesn't use everyday -has a few things still in her worry box but normally related to  3-coping -how to manage anxiety -techniques -structured schedule -limited downtime  Treatment Plan Problems: Anxiety, Childhood Trauma, Low Self-Esteem, Spiritual Confusion Symptoms: Verbalization of a desire for a closer relationship to a higher power. Feelings and attitudes about a higher power that are characterized by fear, anger, and distrust. A felt need for a higher power, but because upbringing contained no religious education or training, does not know where or how to begin. Difficulty in saying no to others; assumes not being liked by others. Fear of rejection by others, especially peer group. Description of parents as physically or emotionally neglectful as they were chemically dependent, too busy, absent, etc. Irrational fears, suppressed rage, low self-esteem, identity conflicts, depression, or anxious insecurity related to painful early life experiences. Excessive and/or unrealistic worry that is difficult to control occurring more days than not for at least 6 months about a number of events or activities. Motor tension (e.g., restlessness, tiredness, shakiness, muscle tension). Autonomic hyperactivity (e.g., palpitations, shortness of breath, dry mouth, trouble swallowing, nausea, diarrhea). Hypervigilance (e.g., feeling constantly on edge, experiencing concentration difficulties, having trouble falling or staying  asleep, exhibiting a general state of irritability). Goals: Reduce overall frequency, intensity, and duration of the anxiety so that daily functioning is not impaired. Stabilize anxiety level while increasing ability to function on a daily basis. Resolve the core conflict that is the source of anxiety. Enhance ability to effectively cope with the full variety of life's worries and anxieties. Learn and implement coping skills that result in a reduction of anxiety and worry, and improved daily functioning. Resolve past childhood/family issues, leading to less anger and depression, greater self-esteem, security, and confidence. Release the emotions associated with past childhood/family issues, resulting in less resentment and more serenity. Let go of blame and begin to forgive others for pain caused in childhood. Elevate self-esteem. Develop a consistent, positive self-image. Establish an inward sense of self-worth, confidence, and competence. Demonstrate improved self-esteem through more pride in appearance, more assertiveness, greater eye contact, and identification of positive traits in self-talk messages. Clarify spiritual concepts and instill a freedom to approach a higher power as a resource for support. Increase belief in and development of a relationship with a higher power. Begin a faith in a higher power and incorporate it into support system. Resolve issues that have prevented faith or belief from developing and growing. Objectives target date for all objectives is 05/13/2025: Verbalize an understanding of the cognitive, physiological, and behavioral components of anxiety and its treatment. Learn and implement calming skills to reduce overall anxiety and manage anxiety symptoms. Verbalize an understanding of the role that cognitive biases play in excessive irrational worry and persistent anxiety symptoms. Identify, challenge, and replace biased, fearful self-talk with positive,  realistic, and empowering self-talk. Learn and implement relapse prevention strategies for managing possible future anxiety symptoms. Learn to accept limitations in life and commit to tolerating, rather than avoiding, unpleasant emotions while accomplishing meaningful goals. Describe each family member and identify the role  each played within the family. Identify patterns of abuse, neglect, or abandonment within the family of origin, both current and historical, nuclear and extended. Identify feelings associated with major traumatic incidents in childhood and with parental child-rearing patterns. Acknowledge feeling less competent than most others. Decrease the frequency of negative self-descriptive statements and increase frequency of positive self-descriptive statements. Identify and engage in activities that would improve self-image by being consistent with one's values. Identify positive traits and talents about self. Articulate a plan to be proactive in trying to get identified needs met. Increase the frequency of assertive behaviors. Summarize the highlights of own spiritual quest or journey to this date. Describe early life training in spiritual concepts and identify its impact on current religious beliefs. Identify the difference between religion and faith. Implement daily attempts to be in contact with higher power. Acknowledge the need to separate negative past experiences with religious people from the current spiritual evaluation. Ask a respected person who has apparent spiritual depth to serve as a Dance movement psychotherapist. Attend groups dedicated to enriching spirituality. Read books that focus on furthering a connection with a higher power. Interventions: Explore the client's assessment of himself/herself and what is verbalized as the basis for negative self-perception. Help the client analyze his/her values and the congruence or incongruence between them and the client's daily  activities. Identify and assign activities congruent with the client's values; process them toward improving self-concept and self-esteem. Assign the client the exercise of identifying his/her positive physical characteristics in a mirror to help him/her become more comfortable with himself/herself. Ask the client to keep building a list of positive traits and have him/her read the list at the beginning and end of each session (or assign Acknowledging My Strengths or What Are My Good Qualities? in the Adult Psychotherapy Homework Planner by [facility]); reinforce the client's positive self-descriptive statements. Assist the client in identifying and verbalizing his/her needs, met and unmet. Assist the client in developing a specific action plan to get each need met (or assign Satisfying Unmet Emotional Needs in the Adult Psychotherapy Homework Planner by Jenniffer). Train the client in assertiveness or refer him/her to a group that will educate and facilitate assertiveness skills via lectures and assignments. Assist the client in becoming aware of how he/she expresses or acts out negative feelings about himself/herself. Help the client reframe his/her negative assessment of himself/herself. Ask the client to talk about or write the story of his/her spiritual quest/journey (or assign My History of Spirituality from the Adult Psychotherapy Homework Planner by Jenniffer); process the journey material. Assist the client in evaluating religious tenets separated from painful emotional experiences with religious people in his/her past. Explore the religious distortions and judgmentalism that the client has been subjected to by others. Help the client find a mentor to guide his/her spiritual development. Make the client aware of opportunities for spiritual enrichment (e.g., Bible studies, study groups, fellowship groups); process the experiences he/she decides to pursue. Suggest that the client attend a  spiritual retreat (e.g., [employer] or [facility] [employer]) and report to therapist what the experience was like for him/her and what he/she gained from the experience. Ask the client to read books to cultivate his/her spirituality (e.g., The Cloister Walk by Kay; The Purpose-driven Life by Butler; The Care of the Soul by Georgina). Review the client's early life experiences surrounding belief in a higher power and explore how this affects current beliefs. Educate the client on the difference between religion and spirituality. Recommend that the client implement daily meditations  and/or prayer; process the experience. Assign the client to write a daily note to his/her higher power. Encourage and assist the client in developing and implementing a daily devotional time or other ritual that will foster his/her spiritual growth. Explore the client's schema and self-talk that mediate his/her fear response; assist him/her in challenging the biases; replace the distorted messages with reality-based alternatives and positive, realistic self-talk that will increase his/her self-confidence in coping with irrational fears (see Cognitive Therapy of Anxiety Disorders by Gretta armin Mon). Discuss with the client the distinction between a lapse and relapse, associating a lapse with an initial and reversible return of worry, anxiety symptoms, or urges to avoid, and relapse with the decision to continue the fearful and avoidant patterns. Identify and rehearse with the client the management of future situations or circumstances in which lapses could occur. Develop a coping card on which coping strategies and other important information (e.g., Breathe deeply and relax, Challenge unrealistic worries, Use problem-solving) are written for the client's later use. Use techniques from Acceptance and Commitment Therapy to help client accept uncomfortable realities such as lack of complete control, imperfections, and  uncertainty and tolerate unpleasant emotions and thoughts in order to accomplish value-consistent goals. Discuss how generalized anxiety typically involves excessive worry about unrealistic threats, various bodily expressions of tension, overarousal, and hypervigilance, and avoidance of what is threatening that interact to maintain the problem (see Mastery of Your Anxiety and Worry: Therapist Guide by Venson River, and Barlow; Treating Generalized Anxiety Disorder by Rygh and Red). Assign the client to read about progressive muscle relaxation and other calming strategies in relevant books or treatment manuals (e.g., Progressive Relaxation Training by Thornell and Elmer; Mastery of Your Anxiety and Worry: Workbook by River armin Given). Discuss examples demonstrating that unrealistic worry typically overestimates the probability of threats and underestimates or overlooks the client's ability to manage realistic demands (or assign Past Successful Anxiety Coping in the Adult Psychotherapy Homework Planner by Jenniffer). Teach the client calming/relaxation skills (e.g., applied relaxation, progressive muscle relaxation, cue controlled relaxation; mindful breathing; biofeedback) and how to discriminate better between relaxation and tension; teach the client how to apply these skills to his/her daily life (e.g., New Directions in Progressive Muscle Relaxation by Thornell Elmer, and Hazlett-Stevens; Treating Generalized Anxiety Disorder by Rygh and Red). Assist the client in analyzing his/her worries by examining potential biases such as the probability of the negative expectation occurring, the real consequences of it occurring, his/her ability to control the outcome, the worst possible outcome, and his/her ability to accept it (see Analyze the Probability of a Feared Event in the Adult Psychotherapy Homework Planner by Jenniffer; Cognitive Therapy of Anxiety Disorders by Gretta armin Mon). Assist the client in clarifying his/her role within the family and his/her feelings connected to that role. Assign the client to ask parents about their family backgrounds and develop insight regarding patterns of behavior and causes for parents' dysfunction. Explore the client's painful childhood experiences (or assign Share the Painful Memory in the Adult Psychotherapy Homework Planner by Jenniffer). Support and encourage the client when he/she begins to express feelings of rage, sadness, fear, and rejection relating to family abuse or neglect. Assign the client to record feelings in a journal that describes memories, behavior, and emotions tied to his/her traumatic childhood experiences (or assign How the Trauma Affects Me in the Adult Psychotherapy Homework Planner by Jenniffer).  Diagnosis:Generalized anxiety disorder  Depression with anxiety  Plan:  -meet again on Monday, July 01, 2024 at  4pm online

## 2024-06-24 ENCOUNTER — Ambulatory Visit: Admitting: Professional

## 2024-07-01 ENCOUNTER — Ambulatory Visit: Admitting: Professional

## 2024-07-01 ENCOUNTER — Encounter: Payer: Self-pay | Admitting: Professional

## 2024-07-01 DIAGNOSIS — F411 Generalized anxiety disorder: Secondary | ICD-10-CM

## 2024-07-01 DIAGNOSIS — F418 Other specified anxiety disorders: Secondary | ICD-10-CM

## 2024-07-01 NOTE — Progress Notes (Signed)
 [facility] Counselor/Therapist Progress Note  Patient ID: Ruth Garcia, MRN: [id number],    Date: 07/01/2024  Time Spent: 40 minutes [id number]  Treatment Type: Individual Therapy  Risk Assessment: Danger to Self:  No Self-injurious Behavior: No Danger to Others: No  Subjective: The patient arrived late for her in person appointment.  1-anxiety -continues to do well in managing her anxiety -thinks that the structure has really happened -sisters have noticed an improvement in her anxiety 2-treatment plan -reviewed objectives with pt -pt has made excellent progress on her objectives in 3/4 areas -begin focus at next session on childhood issues (father's estrangement)  Treatment Plan Problems: Anxiety, Childhood Trauma, Low Self-Esteem, Spiritual Confusion Symptoms: Verbalization of a desire for a closer relationship to a higher power. Feelings and attitudes about a higher power that are characterized by fear, anger, and distrust. A felt need for a higher power, but because upbringing contained no religious education or training, does not know where or how to begin. Difficulty in saying no to others; assumes not being liked by others. Fear of rejection by others, especially peer group. Description of parents as physically or emotionally neglectful as they were chemically dependent, too busy, absent, etc. Irrational fears, suppressed rage, low self-esteem, identity conflicts, depression, or anxious insecurity related to painful early life experiences. Excessive and/or unrealistic worry that is difficult to control occurring more days than not for at least 6 months about a number of events or activities. Motor tension (e.g., restlessness, tiredness, shakiness, muscle tension). Autonomic hyperactivity (e.g., palpitations, shortness of breath, dry mouth, trouble swallowing, nausea, diarrhea). Hypervigilance (e.g., feeling constantly on edge, experiencing concentration  difficulties, having trouble falling or staying asleep, exhibiting a general state of irritability). Goals: Reduce overall frequency, intensity, and duration of the anxiety so that daily functioning is not impaired. Stabilize anxiety level while increasing ability to function on a daily basis. Resolve the core conflict that is the source of anxiety. Enhance ability to effectively cope with the full variety of life's worries and anxieties. Learn and implement coping skills that result in a reduction of anxiety and worry, and improved daily functioning. Resolve past childhood/family issues, leading to less anger and depression, greater self-esteem, security, and confidence. Release the emotions associated with past childhood/family issues, resulting in less resentment and more serenity. Let go of blame and begin to forgive others for pain caused in childhood. Elevate self-esteem. Develop a consistent, positive self-image. Establish an inward sense of self-worth, confidence, and competence. Demonstrate improved self-esteem through more pride in appearance, more assertiveness, greater eye contact, and identification of positive traits in self-talk messages. Clarify spiritual concepts and instill a freedom to approach a higher power as a resource for support. Increase belief in and development of a relationship with a higher power. Begin a faith in a higher power and incorporate it into support system. Resolve issues that have prevented faith or belief from developing and growing. Objectives target date for all objectives is 05/13/2025: Verbalize an understanding of the cognitive, physiological, and behavioral components of anxiety and its treatment.   80% Learn and implement calming skills to reduce overall anxiety and manage anxiety symptoms.   70% Verbalize an understanding of the role that cognitive biases play in excessive irrational worry and persistent anxiety symptoms.   100% Identify,  challenge, and replace biased, fearful self-talk with positive, realistic, and empowering self-talk.   90% Learn and implement relapse prevention strategies for managing possible future anxiety symptoms.   75% Learn to accept limitations  in life and commit to tolerating, rather than avoiding, unpleasant emotions while accomplishing meaningful goals.  70% Describe each family member and identify the role each played within the family. 0% Identify patterns of abuse, neglect, or abandonment within the family of origin, both current and historical, nuclear and extended. Identify feelings associated with major traumatic incidents in childhood and with parental child-rearing patterns. Acknowledge feeling less competent than most others.   100% happens when spiraling Decrease the frequency of negative self-descriptive statements and increase frequency of positive self-descriptive statements.   80% Identify and engage in activities that would improve self-image by being consistent with one's values.  80% Identify positive traits and talents about self.   100% Articulate a plan to be proactive in trying to get identified needs met.   90% Increase the frequency of assertive behaviors.   90% Summarize the highlights of own spiritual quest or journey to this date.   90% Describe early life training in spiritual concepts and identify its impact on current religious beliefs.   90% Identify the difference between religion and faith.   90%    Implement daily attempts to be in contact with higher power.   90% Acknowledge the need to separate negative past experiences with religious people from the current spiritual evaluation. Ask a respected person who has apparent spiritual depth to serve as a Dance movement psychotherapist. Attend groups dedicated to enriching spirituality. Read books that focus on furthering a connection with a higher power. Interventions: Explore the client's assessment of himself/herself and what is verbalized as  the basis for negative self-perception. Help the client analyze his/her values and the congruence or incongruence between them and the client's daily activities. Identify and assign activities congruent with the client's values; process them toward improving self-concept and self-esteem. Assign the client the exercise of identifying his/her positive physical characteristics in a mirror to help him/her become more comfortable with himself/herself. Ask the client to keep building a list of positive traits and have him/her read the list at the beginning and end of each session (or assign Acknowledging My Strengths or What Are My Good Qualities? in the Adult Psychotherapy Homework Planner by [facility]); reinforce the client's positive self-descriptive statements. Assist the client in identifying and verbalizing his/her needs, met and unmet. Assist the client in developing a specific action plan to get each need met (or assign Satisfying Unmet Emotional Needs in the Adult Psychotherapy Homework Planner by Jenniffer). Train the client in assertiveness or refer him/her to a group that will educate and facilitate assertiveness skills via lectures and assignments. Assist the client in becoming aware of how he/she expresses or acts out negative feelings about himself/herself. Help the client reframe his/her negative assessment of himself/herself. Ask the client to talk about or write the story of his/her spiritual quest/journey (or assign My History of Spirituality from the Adult Psychotherapy Homework Planner by Jenniffer); process the journey material. Assist the client in evaluating religious tenets separated from painful emotional experiences with religious people in his/her past. Explore the religious distortions and judgmentalism that the client has been subjected to by others. Help the client find a mentor to guide his/her spiritual development. Make the client aware of opportunities for spiritual  enrichment (e.g., Bible studies, study groups, fellowship groups); process the experiences he/she decides to pursue. Suggest that the client attend a spiritual retreat (e.g., [place] or [facility] [employer]) and report to therapist what the experience was like for him/her and what he/she gained from the experience. Ask the client  to read books to cultivate his/her spirituality (e.g., The Cloister Walk by [employer]; The Purpose-driven Life by Butler; The Care of the Soul by Georgina). Review the client's early life experiences surrounding belief in a higher power and explore how this affects current beliefs. Educate the client on the difference between religion and spirituality. Recommend that the client implement daily meditations and/or prayer; process the experience. Assign the client to write a daily note to his/her higher power. Encourage and assist the client in developing and implementing a daily devotional time or other ritual that will foster his/her spiritual growth. Explore the client's schema and self-talk that mediate his/her fear response; assist him/her in challenging the biases; replace the distorted messages with reality-based alternatives and positive, realistic self-talk that will increase his/her self-confidence in coping with irrational fears (see Cognitive Therapy of Anxiety Disorders by Gretta armin Mon). Discuss with the client the distinction between a lapse and relapse, associating a lapse with an initial and reversible return of worry, anxiety symptoms, or urges to avoid, and relapse with the decision to continue the fearful and avoidant patterns. Identify and rehearse with the client the management of future situations or circumstances in which lapses could occur. Develop a coping card on which coping strategies and other important information (e.g., Breathe deeply and relax, Challenge unrealistic worries, Use problem-solving) are written for the client's later use. Use  techniques from Acceptance and Commitment Therapy to help client accept uncomfortable realities such as lack of complete control, imperfections, and uncertainty and tolerate unpleasant emotions and thoughts in order to accomplish value-consistent goals. Discuss how generalized anxiety typically involves excessive worry about unrealistic threats, various bodily expressions of tension, overarousal, and hypervigilance, and avoidance of what is threatening that interact to maintain the problem (see Mastery of Your Anxiety and Worry: Therapist Guide by Venson River, and Barlow; Treating Generalized Anxiety Disorder by Rygh and Red). Assign the client to read about progressive muscle relaxation and other calming strategies in relevant books or treatment manuals (e.g., Progressive Relaxation Training by Thornell and Elmer; Mastery of Your Anxiety and Worry: Workbook by River armin Given). Discuss examples demonstrating that unrealistic worry typically overestimates the probability of threats and underestimates or overlooks the client's ability to manage realistic demands (or assign Past Successful Anxiety Coping in the Adult Psychotherapy Homework Planner by Jenniffer). Teach the client calming/relaxation skills (e.g., applied relaxation, progressive muscle relaxation, cue controlled relaxation; mindful breathing; biofeedback) and how to discriminate better between relaxation and tension; teach the client how to apply these skills to his/her daily life (e.g., New Directions in Progressive Muscle Relaxation by Thornell Elmer, and Hazlett-Stevens; Treating Generalized Anxiety Disorder by Rygh and Red). Assist the client in analyzing his/her worries by examining potential biases such as the probability of the negative expectation occurring, the real consequences of it occurring, his/her ability to control the outcome, the worst possible outcome, and his/her ability to accept it (see Analyze the  Probability of a Feared Event in the Adult Psychotherapy Homework Planner by Jenniffer; Cognitive Therapy of Anxiety Disorders by Gretta armin Mon). Assist the client in clarifying his/her role within the family and his/her feelings connected to that role. Assign the client to ask parents about their family backgrounds and develop insight regarding patterns of behavior and causes for parents' dysfunction. Explore the client's painful childhood experiences (or assign Share the Painful Memory in the Adult Psychotherapy Homework Planner by Jenniffer). Support and encourage the client when he/she begins to express feelings of rage, sadness, fear, and rejection  relating to family abuse or neglect. Assign the client to record feelings in a journal that describes memories, behavior, and emotions tied to his/her traumatic childhood experiences (or assign How the Trauma Affects Me in the Adult Psychotherapy Homework Planner by Jenniffer).  Diagnosis:Generalized anxiety disorder  Depression with anxiety  Plan:  -consider what issues remain that you have carried since childhood and come prepared to discuss -meet again on Tuesday, July 15, 2024 at [employer]

## 2024-07-08 ENCOUNTER — Ambulatory Visit: Admitting: Professional

## 2024-07-10 DIAGNOSIS — L578 Other skin changes due to chronic exposure to nonionizing radiation: Secondary | ICD-10-CM | POA: Diagnosis not present

## 2024-07-10 DIAGNOSIS — L710 Perioral dermatitis: Secondary | ICD-10-CM | POA: Diagnosis not present

## 2024-07-10 DIAGNOSIS — L814 Other melanin hyperpigmentation: Secondary | ICD-10-CM | POA: Diagnosis not present

## 2024-07-10 DIAGNOSIS — D225 Melanocytic nevi of trunk: Secondary | ICD-10-CM | POA: Diagnosis not present

## 2024-07-14 ENCOUNTER — Ambulatory Visit: Admitting: Obstetrics & Gynecology

## 2024-07-15 ENCOUNTER — Encounter: Payer: Self-pay | Admitting: Family Medicine

## 2024-07-15 ENCOUNTER — Encounter: Payer: Self-pay | Admitting: Professional

## 2024-07-15 ENCOUNTER — Ambulatory Visit: Admitting: Professional

## 2024-07-15 ENCOUNTER — Ambulatory Visit: Admitting: Family Medicine

## 2024-07-15 VITALS — BP 120/60 | HR 66 | Ht 65.0 in | Wt 153.0 lb

## 2024-07-15 DIAGNOSIS — L710 Perioral dermatitis: Secondary | ICD-10-CM | POA: Diagnosis not present

## 2024-07-15 DIAGNOSIS — F411 Generalized anxiety disorder: Secondary | ICD-10-CM

## 2024-07-15 DIAGNOSIS — R590 Localized enlarged lymph nodes: Secondary | ICD-10-CM | POA: Diagnosis not present

## 2024-07-15 DIAGNOSIS — F418 Other specified anxiety disorders: Secondary | ICD-10-CM | POA: Diagnosis not present

## 2024-07-15 DIAGNOSIS — M79672 Pain in left foot: Secondary | ICD-10-CM

## 2024-07-15 DIAGNOSIS — F4329 Adjustment disorder with other symptoms: Secondary | ICD-10-CM | POA: Diagnosis not present

## 2024-07-15 DIAGNOSIS — M79671 Pain in right foot: Secondary | ICD-10-CM

## 2024-07-15 MED ORDER — CITALOPRAM HYDROBROMIDE 20 MG PO TABS: 20.0000 mg | ORAL_TABLET | Freq: Every day | ORAL | 1 refills | Status: DC

## 2024-07-15 NOTE — Progress Notes (Signed)
 Established Patient Office Visit  Subjective  Patient ID: Ruth Garcia, female    DOB: 09-28-1971  Age: 53 y.o. MRN: [id number]  Chief Complaint  Patient presents with   mood    HPI  Here today for follow-up of generalized  anxiety and depression-she had gone back up to citalopram  20 mg which she had taken in the past.  She had just increased her dose when I saw her for her wellness visit about 2 months ago.  She has been engaging in therapy.  Last PHQ-9 and GAD score was 15.    Discussed the use of AI scribe software for clinical note transcription with the patient, who gave verbal consent to proceed.  History of Present Illness Ruth Garcia is a 53 year old female who presents for follow-up of depression management and evaluation of foot pain.  Depressive symptoms - Improvement in depressive symptoms since increasing medication dose from 10 mg to 20 mg - PHQ-9 score decreased from 15 to 3 - Improvement attributed in part to returning to work as a Runner, broadcasting/film/video, which provides routine - Increased difficulty with depressive symptoms during the summer when not working, attributed to having 'too much time'  Perioral dermatitis - Currently taking doxycycline  and using a topical cream for treatment - Diagnosis made by dermatologist - Dermatologist suspects stress as a trigger - No recent changes in makeup, cleanser, or toothpaste - Duration of treatment less than one week - Desires resolution of symptoms  Neck tension - Intermittent neck tension attributed to work-related stress  Foot mass and pain - Presence of a noticeable bump on the lateral right foot  - Bump has been present for some time and appears to have increased in size - Initially identified by pedicurist and later confirmed by dermatologist - Causing rubbing inside her shoe - No prior treatment for this issue  Lymphadenopathy evaluation - History of a lymph node evaluated with ultrasound in mid-July, which  showed normal results       ROS    Objective:     BP 120/60   Pulse 66   Ht 5' 5 (1.651 m)   Wt 153 lb 0.6 oz (69.4 kg)   SpO2 95%   BMI 25.47 kg/m    Physical Exam Vitals reviewed.  Constitutional:      Appearance: Normal appearance.  HENT:     Head: Normocephalic.  Neck:     Comments: On the posterior left neck there is a palpable approximately 1 cm lymph node on the lateral left side of the neck over the sternocleidomastoid there is a smaller softer maybe half a centimeter lymph node Pulmonary:     Effort: Pulmonary effort is normal.  Musculoskeletal:     Comments: She does have a significant protrusion of the proximal head of the fifth metatarsal with a little bit of callus formation much more prominent on the right compared to her left foot.  Neurological:     Mental Status: She is alert and oriented to person, place, and time.  Psychiatric:        Mood and Affect: Mood normal.        Behavior: Behavior normal.      No results found for any visits on 07/15/24.    The 10-year ASCVD risk score (Arnett DK, et al., 2019) is: 1.4%    Assessment & Plan:   Problem List Items Addressed This Visit       Other   Stress and adjustment reaction  Relevant Medications   citalopram  (CELEXA ) 20 MG tablet   Other Visit Diagnoses       Foot pain, right    -  Primary   Relevant Orders   Ambulatory referral to Podiatry     Enlarged lymph node in neck         Perioral dermatitis          Assessment and Plan Assessment & Plan Adjustment disorder Improving with current medication. PHQ-9 score decreased from 15 to 3. Returning to work has helped. - Maintain medication at 20 mg. - Consider reducing to 10 mg in January if stable. - Increase dosage in summer if symptoms worsen. - Reassess in January for dosage adjustment.  Perioral dermatitis Diagnosed by dermatologist. Stress suspected as a trigger. - Continue doxycycline  and topical cream. - Monitor  response and report to dermatologist if no improvement.  Right foot tuberosity at the proximal head of the fifth metatarsal Causing discomfort, larger than left, likely due to pressure and foot mechanics. - Refer to podiatrist Dr. Sikora - Consider offloading pressure from affected area.  Enlarged left posterior cervical lymph node Unchanged size, no tenderness. Previous ultrasound normal. - Follow-up ultrasound in six months if palpable. - Reassess in January for earlier imaging if needed.   Return in about 4 months (around 11/14/2024) for Mood.    Dorothyann Byars, MD

## 2024-07-15 NOTE — Progress Notes (Signed)
 [facility] Counselor/Therapist Progress Note  Patient ID: Ruth Garcia, MRN: [id number],    Date: 07/15/2024  Time Spent: 39 minutes [id number]  Treatment Type: Individual Therapy  Risk Assessment: Danger to Self:  No Self-injurious Behavior: No Danger to Others: No  Subjective: This session was held via video teletherapy. The patient consented to video teletherapy and was located at her home room during this session. She is aware it is the responsibility of the patient to secure confidentiality on her end of the session. The provider was in a private home office for the duration of this session.    The patient arrived early for her Caregility appointment  1-childhood trauma -different ways to forgive -issues remain that you have carried since childhood  -she thinks forgiving her dad and not loyal to her mom -he was not a good father -pt thinks he needs to know how it impacted her -she doesn't really think about it -he's not a daddy he's a father -pt has been thinking more actively about modeling forgiveness -I really feel like he never gave our family a chance 2-professional -school year going well  Treatment Plan Problems: Anxiety, Childhood Trauma, Low Self-Esteem, Spiritual Confusion Symptoms: Verbalization of a desire for a closer relationship to a higher power. Feelings and attitudes about a higher power that are characterized by fear, anger, and distrust. A felt need for a higher power, but because upbringing contained no religious education or training, does not know where or how to begin. Difficulty in saying no to others; assumes not being liked by others. Fear of rejection by others, especially peer group. Description of parents as physically or emotionally neglectful as they were chemically dependent, too busy, absent, etc. Irrational fears, suppressed rage, low self-esteem, identity conflicts, depression, or anxious insecurity related to painful  early life experiences. Excessive and/or unrealistic worry that is difficult to control occurring more days than not for at least 6 months about a number of events or activities. Motor tension (e.g., restlessness, tiredness, shakiness, muscle tension). Autonomic hyperactivity (e.g., palpitations, shortness of breath, dry mouth, trouble swallowing, nausea, diarrhea). Hypervigilance (e.g., feeling constantly on edge, experiencing concentration difficulties, having trouble falling or staying asleep, exhibiting a general state of irritability). Goals: Reduce overall frequency, intensity, and duration of the anxiety so that daily functioning is not impaired. Stabilize anxiety level while increasing ability to function on a daily basis. Resolve the core conflict that is the source of anxiety. Enhance ability to effectively cope with the full variety of life's worries and anxieties. Learn and implement coping skills that result in a reduction of anxiety and worry, and improved daily functioning. Resolve past childhood/family issues, leading to less anger and depression, greater self-esteem, security, and confidence. Release the emotions associated with past childhood/family issues, resulting in less resentment and more serenity. Let go of blame and begin to forgive others for pain caused in childhood. Elevate self-esteem. Develop a consistent, positive self-image. Establish an inward sense of self-worth, confidence, and competence. Demonstrate improved self-esteem through more pride in appearance, more assertiveness, greater eye contact, and identification of positive traits in self-talk messages. Clarify spiritual concepts and instill a freedom to approach a higher power as a resource for support. Increase belief in and development of a relationship with a higher power. Begin a faith in a higher power and incorporate it into support system. Resolve issues that have prevented faith or belief from  developing and growing. Objectives target date for all objectives is 05/13/2025: Verbalize an understanding  of the cognitive, physiological, and behavioral components of anxiety and its treatment.   80% Learn and implement calming skills to reduce overall anxiety and manage anxiety symptoms.   70% Verbalize an understanding of the role that cognitive biases play in excessive irrational worry and persistent anxiety symptoms.   100% Identify, challenge, and replace biased, fearful self-talk with positive, realistic, and empowering self-talk.   90% Learn and implement relapse prevention strategies for managing possible future anxiety symptoms.   75% Learn to accept limitations in life and commit to tolerating, rather than avoiding, unpleasant emotions while accomplishing meaningful goals.  70% Describe each family member and identify the role each played within the family. 0% Identify patterns of abuse, neglect, or abandonment within the family of origin, both current and historical, nuclear and extended. Identify feelings associated with major traumatic incidents in childhood and with parental child-rearing patterns. Acknowledge feeling less competent than most others.   100% happens when spiraling Decrease the frequency of negative self-descriptive statements and increase frequency of positive self-descriptive statements.   80% Identify and engage in activities that would improve self-image by being consistent with one's values.  80% Identify positive traits and talents about self.   100% Articulate a plan to be proactive in trying to get identified needs met.   90% Increase the frequency of assertive behaviors.   90% Summarize the highlights of own spiritual quest or journey to this date.   90% Describe early life training in spiritual concepts and identify its impact on current religious beliefs.   90% Identify the difference between religion and faith.   90%    Implement daily attempts to be in  contact with higher power.   90% Acknowledge the need to separate negative past experiences with religious people from the current spiritual evaluation. Ask a respected person who has apparent spiritual depth to serve as a Dance movement psychotherapist. Attend groups dedicated to enriching spirituality. Read books that focus on furthering a connection with a higher power. Interventions: Explore the client's assessment of himself/herself and what is verbalized as the basis for negative self-perception. Help the client analyze his/her values and the congruence or incongruence between them and the client's daily activities. Identify and assign activities congruent with the client's values; process them toward improving self-concept and self-esteem. Assign the client the exercise of identifying his/her positive physical characteristics in a mirror to help him/her become more comfortable with himself/herself. Ask the client to keep building a list of positive traits and have him/her read the list at the beginning and end of each session (or assign Acknowledging My Strengths or What Are My Good Qualities? in the Adult Psychotherapy Homework Planner by [facility]); reinforce the client's positive self-descriptive statements. Assist the client in identifying and verbalizing his/her needs, met and unmet. Assist the client in developing a specific action plan to get each need met (or assign Satisfying Unmet Emotional Needs in the Adult Psychotherapy Homework Planner by Jenniffer). Train the client in assertiveness or refer him/her to a group that will educate and facilitate assertiveness skills via lectures and assignments. Assist the client in becoming aware of how he/she expresses or acts out negative feelings about himself/herself. Help the client reframe his/her negative assessment of himself/herself. Ask the client to talk about or write the story of his/her spiritual quest/journey (or assign My History of Spirituality from  the Adult Psychotherapy Homework Planner by Jenniffer); process the journey material. Assist the client in evaluating religious tenets separated from painful emotional experiences with religious people  in his/her past. Explore the religious distortions and judgmentalism that the client has been subjected to by others. Help the client find a mentor to guide his/her spiritual development. Make the client aware of opportunities for spiritual enrichment (e.g., Bible studies, study groups, fellowship groups); process the experiences he/she decides to pursue. Suggest that the client attend a spiritual retreat (e.g., [place] or [facility] [employer]) and report to therapist what the experience was like for him/her and what he/she gained from the experience. Ask the client to read books to cultivate his/her spirituality (e.g., The Cloister Walk by Kay; The Purpose-driven Life by Butler; The Care of the Soul by Georgina). Review the client's early life experiences surrounding belief in a higher power and explore how this affects current beliefs. Educate the client on the difference between religion and spirituality. Recommend that the client implement daily meditations and/or prayer; process the experience. Assign the client to write a daily note to his/her higher power. Encourage and assist the client in developing and implementing a daily devotional time or other ritual that will foster his/her spiritual growth. Explore the client's schema and self-talk that mediate his/her fear response; assist him/her in challenging the biases; replace the distorted messages with reality-based alternatives and positive, realistic self-talk that will increase his/her self-confidence in coping with irrational fears (see Cognitive Therapy of Anxiety Disorders by Gretta armin Mon). Discuss with the client the distinction between a lapse and relapse, associating a lapse with an initial and reversible return of worry, anxiety symptoms,  or urges to avoid, and relapse with the decision to continue the fearful and avoidant patterns. Identify and rehearse with the client the management of future situations or circumstances in which lapses could occur. Develop a coping card on which coping strategies and other important information (e.g., Breathe deeply and relax, Challenge unrealistic worries, Use problem-solving) are written for the client's later use. Use techniques from Acceptance and Commitment Therapy to help client accept uncomfortable realities such as lack of complete control, imperfections, and uncertainty and tolerate unpleasant emotions and thoughts in order to accomplish value-consistent goals. Discuss how generalized anxiety typically involves excessive worry about unrealistic threats, various bodily expressions of tension, overarousal, and hypervigilance, and avoidance of what is threatening that interact to maintain the problem (see Mastery of Your Anxiety and Worry: Therapist Guide by Venson River, and Barlow; Treating Generalized Anxiety Disorder by Rygh and Red). Assign the client to read about progressive muscle relaxation and other calming strategies in relevant books or treatment manuals (e.g., Progressive Relaxation Training by Thornell and Elmer; Mastery of Your Anxiety and Worry: Workbook by River armin Given). Discuss examples demonstrating that unrealistic worry typically overestimates the probability of threats and underestimates or overlooks the client's ability to manage realistic demands (or assign Past Successful Anxiety Coping in the Adult Psychotherapy Homework Planner by Jenniffer). Teach the client calming/relaxation skills (e.g., applied relaxation, progressive muscle relaxation, cue controlled relaxation; mindful breathing; biofeedback) and how to discriminate better between relaxation and tension; teach the client how to apply these skills to his/her daily life (e.g., New Directions in  Progressive Muscle Relaxation by Thornell Elmer, and Hazlett-Stevens; Treating Generalized Anxiety Disorder by Rygh and Red). Assist the client in analyzing his/her worries by examining potential biases such as the probability of the negative expectation occurring, the real consequences of it occurring, his/her ability to control the outcome, the worst possible outcome, and his/her ability to accept it (see Analyze the Probability of a Feared Event in the Adult Psychotherapy Homework Planner  by Jenniffer; Cognitive Therapy of Anxiety Disorders by Gretta armin Mon). Assist the client in clarifying his/her role within the family and his/her feelings connected to that role. Assign the client to ask parents about their family backgrounds and develop insight regarding patterns of behavior and causes for parents' dysfunction. Explore the client's painful childhood experiences (or assign Share the Painful Memory in the Adult Psychotherapy Homework Planner by Jenniffer). Support and encourage the client when he/she begins to express feelings of rage, sadness, fear, and rejection relating to family abuse or neglect. Assign the client to record feelings in a journal that describes memories, behavior, and emotions tied to his/her traumatic childhood experiences (or assign How the Trauma Affects Me in the Adult Psychotherapy Homework Planner by Jenniffer).  Diagnosis:Generalized anxiety disorder  Depression with anxiety  Plan:  -use coping skills to deal with anxious thoughts as they occur -make decisions about how to forgive your father -meet again on Tuesday, July 29, 2024 at [employer]

## 2024-07-16 ENCOUNTER — Encounter: Payer: Self-pay | Admitting: Family Medicine

## 2024-07-22 ENCOUNTER — Ambulatory Visit: Admitting: Professional

## 2024-07-28 ENCOUNTER — Encounter: Payer: Self-pay | Admitting: Obstetrics & Gynecology

## 2024-07-28 ENCOUNTER — Other Ambulatory Visit (HOSPITAL_COMMUNITY)
Admission: RE | Admit: 2024-07-28 | Discharge: 2024-07-28 | Disposition: A | Discharge status: Home / Self Care | Source: Ambulatory Visit | Attending: Obstetrics & Gynecology | Admitting: Obstetrics & Gynecology

## 2024-07-28 ENCOUNTER — Ambulatory Visit: Admitting: Professional

## 2024-07-28 ENCOUNTER — Ambulatory Visit: Admitting: Obstetrics & Gynecology

## 2024-07-28 ENCOUNTER — Encounter: Payer: Self-pay | Admitting: Professional

## 2024-07-28 VITALS — BP 123/74 | HR 66 | Ht 66.0 in | Wt 153.0 lb

## 2024-07-28 DIAGNOSIS — R87611 Atypical squamous cells cannot exclude high grade squamous intraepithelial lesion on cytologic smear of cervix (ASC-H): Secondary | ICD-10-CM | POA: Diagnosis not present

## 2024-07-28 DIAGNOSIS — N870 Mild cervical dysplasia: Secondary | ICD-10-CM

## 2024-07-28 DIAGNOSIS — F418 Other specified anxiety disorders: Secondary | ICD-10-CM

## 2024-07-28 DIAGNOSIS — Z01419 Encounter for gynecological examination (general) (routine) without abnormal findings: Secondary | ICD-10-CM

## 2024-07-28 DIAGNOSIS — F411 Generalized anxiety disorder: Secondary | ICD-10-CM

## 2024-07-28 NOTE — Progress Notes (Signed)
 [facility] Counselor/Therapist Progress Note  Patient ID: Ruth Garcia, MRN: [id number],    Date: 07/28/2024  Time Spent: 51 minutes 1-151pm  Treatment Type: Individual Therapy  Risk Assessment: Danger to Self:  No Self-injurious Behavior: No Danger to Others: No  Subjective: This session was held via video teletherapy. The patient consented to video teletherapy and was located at her home room during this session. She is aware it is the responsibility of the patient to secure confidentiality on her end of the session. The provider was in a private home office for the duration of this session.    The patient arrived early for her Caregility appointment  1-homework -use coping skills to deal with anxious thoughts as they occur -make decisions about how to forgive your father 2-sister visited form [place] for the weekend -had a nice visit (lunch and shopping on Friday, hiking 5 miles on Saturday, yesterday they  3-challenges with son -diagnosed on spectrum -just had 24th birthday -he is fixated on his goals and he cannot make shift to moving toward adulthood -mom pushing for him to move out and husband does not agree -college degree with great job -so fixated on triathlon and wanting to become a pro -he has some physical issues that occupy his time -son and daughter do not get along 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 Feelings and attitudes about a higher power that are characterized by fear, anger, and distrust. A felt need for a higher power, but because upbringing contained no religious education or training, does not know where or how to begin. Difficulty in saying no to others; assumes not being liked by others. Fear of rejection by others, especially peer  group. Description of parents as physically or emotionally neglectful as they were chemically dependent, too busy, absent, etc. Irrational fears, suppressed rage, low self-esteem, identity conflicts, depression, or anxious insecurity related to painful early life experiences. Excessive and/or unrealistic worry that is difficult to control occurring more days than not for at least 6 months about a number of events or activities. Motor tension (e.g., restlessness, tiredness, shakiness, muscle tension). Autonomic hyperactivity (e.g., palpitations, shortness of breath, dry mouth, trouble swallowing, nausea, diarrhea). Hypervigilance (e.g., feeling constantly on edge, experiencing concentration difficulties, having trouble falling or staying asleep, exhibiting a general state of irritability). Goals: Reduce overall frequency, intensity, and duration of the anxiety so that daily functioning is not impaired. Stabilize anxiety level while increasing ability to function on a daily basis. Resolve the core conflict that is the source of anxiety. Enhance ability to effectively cope with the full variety of life's worries and anxieties. Learn and implement coping skills that result in a reduction of anxiety and worry, and improved daily functioning. Resolve past childhood/family issues, leading to less anger and depression, greater self-esteem, security, and confidence. Release the emotions associated with past childhood/family issues, resulting in less resentment and more serenity. Let go of blame and begin to forgive others for pain caused in childhood. Elevate self-esteem. Develop a consistent, positive self-image. Establish an inward sense of self-worth, confidence, and competence. Demonstrate improved self-esteem through more pride in appearance, more assertiveness, greater eye contact, and identification of positive traits in self-talk messages. Clarify spiritual concepts and instill a freedom to  approach a higher power as a resource for support. Increase belief in and development of a relationship with  a higher power. Begin a faith in a higher power and incorporate it into support system. Resolve issues that have prevented faith or belief from developing and growing. Objectives target date for all objectives is 05/13/2025: Verbalize an understanding of the cognitive, physiological, and behavioral components of anxiety and its treatment.   80% Learn and implement calming skills to reduce overall anxiety and manage anxiety symptoms.   70% Verbalize an understanding of the role that cognitive biases play in excessive irrational worry and persistent anxiety symptoms.   100% Identify, challenge, and replace biased, fearful self-talk with positive, realistic, and empowering self-talk.   90% Learn and implement relapse prevention strategies for managing possible future anxiety symptoms.   75% Learn to accept limitations in life and commit to tolerating, rather than avoiding, unpleasant emotions while accomplishing meaningful goals.  70% Describe each family member and identify the role each played within the family. 0% Identify patterns of abuse, neglect, or abandonment within the family of origin, both current and historical, nuclear and extended. Identify feelings associated with major traumatic incidents in childhood and with parental child-rearing patterns. Acknowledge feeling less competent than most others.   100% happens when spiraling Decrease the frequency of negative self-descriptive statements and increase frequency of positive self-descriptive statements.   80% Identify and engage in activities that would improve self-image by being consistent with one's values.  80% Identify positive traits and talents about self.   100% Articulate a plan to be proactive in trying to get identified needs met.   90% Increase the frequency of assertive behaviors.   90% Summarize the highlights of own  spiritual quest or journey to this date.   90% Describe early life training in spiritual concepts and identify its impact on current religious beliefs.   90% Identify the difference between religion and faith.   90%    Implement daily attempts to be in contact with higher power.   90% Acknowledge the need to separate negative past experiences with religious people from the current spiritual evaluation. Ask a respected person who has apparent spiritual depth to serve as a Dance movement psychotherapist. Attend groups dedicated to enriching spirituality. Read books that focus on furthering a connection with a higher power. Interventions: Explore the client's assessment of himself/herself and what is verbalized as the basis for negative self-perception. Help the client analyze his/her values and the congruence or incongruence between them and the client's daily activities. Identify and assign activities congruent with the client's values; process them toward improving self-concept and self-esteem. Assign the client the exercise of identifying his/her positive physical characteristics in a mirror to help him/her become more comfortable with himself/herself. Ask the client to keep building a list of positive traits and have him/her read the list at the beginning and end of each session (or assign Acknowledging My Strengths or What Are My Good Qualities? in the Adult Psychotherapy Homework Planner by [facility]); reinforce the client's positive self-descriptive statements. Assist the client in identifying and verbalizing his/her needs, met and unmet. Assist the client in developing a specific action plan to get each need met (or assign Satisfying Unmet Emotional Needs in the Adult Psychotherapy Homework Planner by Jenniffer). Train the client in assertiveness or refer him/her to a group that will educate and facilitate assertiveness skills via lectures and assignments. Assist the client in becoming aware of how he/she expresses  or acts out negative feelings about himself/herself. Help the client reframe his/her negative assessment of himself/herself. Ask the client to talk about or write  the story of his/her spiritual quest/journey (or assign My History of Spirituality from the Adult Psychotherapy Homework Planner by Jenniffer); process the journey material. Assist the client in evaluating religious tenets separated from painful emotional experiences with religious people in his/her past. Explore the religious distortions and judgmentalism that the client has been subjected to by others. Help the client find a mentor to guide his/her spiritual development. Make the client aware of opportunities for spiritual enrichment (e.g., Bible studies, study groups, fellowship groups); process the experiences he/she decides to pursue. Suggest that the client attend a spiritual retreat (e.g., [employer] or [employer]) and report to therapist what the experience was like for him/her and what he/she gained from the experience. Ask the client to read books to cultivate his/her spirituality (e.g., The Cloister Walk by Kay; The Purpose-driven Life by Butler; The Care of the Soul by Georgina). Review the client's early life experiences surrounding belief in a higher power and explore how this affects current beliefs. Educate the client on the difference between religion and spirituality. Recommend that the client implement daily meditations and/or prayer; process the experience. Assign the client to write a daily note to his/her higher power. Encourage and assist the client in developing and implementing a daily devotional time or other ritual that will foster his/her spiritual growth. Explore the client's schema and self-talk that mediate his/her fear response; assist him/her in challenging the biases; replace the distorted messages with reality-based alternatives and positive, realistic self-talk that will increase his/her  self-confidence in coping with irrational fears (see Cognitive Therapy of Anxiety Disorders by Gretta armin Mon). Discuss with the client the distinction between a lapse and relapse, associating a lapse with an initial and reversible return of worry, anxiety symptoms, or urges to avoid, and relapse with the decision to continue the fearful and avoidant patterns. Identify and rehearse with the client the management of future situations or circumstances in which lapses could occur. Develop a coping card on which coping strategies and other important information (e.g., Breathe deeply and relax, Challenge unrealistic worries, Use problem-solving) are written for the client's later use. Use techniques from Acceptance and Commitment Therapy to help client accept uncomfortable realities such as lack of complete control, imperfections, and uncertainty and tolerate unpleasant emotions and thoughts in order to accomplish value-consistent goals. Discuss how generalized anxiety typically involves excessive worry about unrealistic threats, various bodily expressions of tension, overarousal, and hypervigilance, and avoidance of what is threatening that interact to maintain the problem (see Mastery of Your Anxiety and Worry: Therapist Guide by Venson River, and Barlow; Treating Generalized Anxiety Disorder by Rygh and Red). Assign the client to read about progressive muscle relaxation and other calming strategies in relevant books or treatment manuals (e.g., Progressive Relaxation Training by Thornell and Elmer; Mastery of Your Anxiety and Worry: Workbook by River armin Given). Discuss examples demonstrating that unrealistic worry typically overestimates the probability of threats and underestimates or overlooks the client's ability to manage realistic demands (or assign Past Successful Anxiety Coping in the Adult Psychotherapy Homework Planner by Jenniffer). Teach the client calming/relaxation skills  (e.g., applied relaxation, progressive muscle relaxation, cue controlled relaxation; mindful breathing; biofeedback) and how to discriminate better between relaxation and tension; teach the client how to apply these skills to his/her daily life (e.g., New Directions in Progressive Muscle Relaxation by Thornell Elmer, and Hazlett-Stevens; Treating Generalized Anxiety Disorder by Rygh and Red). Assist the client in analyzing his/her worries by examining potential biases such as the probability of the  negative expectation occurring, the real consequences of it occurring, his/her ability to control the outcome, the worst possible outcome, and his/her ability to accept it (see Analyze the Probability of a Feared Event in the Adult Psychotherapy Homework Planner by Jenniffer; Cognitive Therapy of Anxiety Disorders by Gretta armin Mon). Assist the client in clarifying his/her role within the family and his/her feelings connected to that role. Assign the client to ask parents about their family backgrounds and develop insight regarding patterns of behavior and causes for parents' dysfunction. Explore the client's painful childhood experiences (or assign Share the Painful Memory in the Adult Psychotherapy Homework Planner by Jenniffer). Support and encourage the client when he/she begins to express feelings of rage, sadness, fear, and rejection relating to family abuse or neglect. Assign the client to record feelings in a journal that describes memories, behavior, and emotions tied to his/her traumatic childhood experiences (or assign How the Trauma Affects Me in the Adult Psychotherapy Homework Planner by Jenniffer).  Diagnosis:Generalized anxiety disorder  Depression with anxiety  Plan:  -read emerging adults article -meet again on Tuesday, August 19, 2024 at [employer]

## 2024-07-28 NOTE — Progress Notes (Signed)
 PCP following enlarged lymph node on back of neck, has scheduled ultrasound in Jan 2026. Last pap smear (date and result):06/25/2023  Atypical Squamous Cells, cannot exclude high grade squamous, Intraepithelial lesion ASC-H Last mammogram (date and result):06/02/2024 Bi Rads Category 1-Negative Last colon screening (date and result):2021, Pt reported normal  Brush:yes Floss:yes Seatbelts: yes Sunscreen: yes

## 2024-07-28 NOTE — Progress Notes (Signed)
 Patient ID: Ruth Garcia, female   DOB: November 19, 1970, 53 y.o.   MRN: [id number]   Subjective:     Ruth Garcia is a 53 y.o. female here for a routine exam.  Current complaints: none.    Gynecologic History No LMP recorded. (Menstrual status: Perimenopausal). Contraception: post menopausal status Last Pap: ASC H---colpo showed CIN 1 Last mammogram: nml aug 2025 Birads 1 Colon Cancer screening--nml--repeat in 10 years (done in 2021) Brush:yes Floss:yes Seatbelts: yes Sunscreen: yes       Obstetric History OB History  Gravida Para Term Preterm AB Living  2 2 2   2   SAB IAB Ectopic Multiple Live Births          # Outcome Date GA Lbr Len/2nd Weight Sex Type Anes PTL Lv  2 Term           1 Term              The following portions of the patient's history were reviewed and updated as appropriate: allergies, current medications, past family history, past medical history, past social history, past surgical history, and problem list.  Review of Systems Pertinent items noted in HPI and remainder of comprehensive ROS otherwise negative.    Objective:   Vitals:   07/28/24 0952  BP: 123/74  Pulse: 66  Weight: 153 lb (69.4 kg)  Height: 5' 6 (1.676 m)    Vitals:  WNL General appearance: alert, cooperative and no distress  HEENT: Normocephalic, without obvious abnormality, atraumatic Eyes: negative Throat: lips, mucosa, and tongue normal; teeth and gums normal  Respiratory: Clear to auscultation bilaterally  CV: Regular rate and rhythm  Breasts:  Normal appearance, no masses or tenderness, no nipple retraction or dimpling  GI: Soft, non-tender; bowel sounds normal; no masses,  no organomegaly  GU: External Genitalia:  Tanner V, no lesion Urethra:  No prolapse   Vagina: Pink, normal rugae, no blood or discharge  Cervix: No CMT, no lesion  Uterus:  Normal size and contour, non tender  Adnexa: Normal, no masses, non tender  Musculoskeletal: No edema, redness or tenderness  in the calves or thighs  Skin: No lesions or rash  Lymphatic: Axillary adenopathy: none     Psychiatric: Normal mood and behavior        Assessment:    Healthy female exam.    Plan:    CIN 1 2024--Rpt Pap with cotesting Alternate mammogrm and breast MRI q 6 months (fam hix brast cancer) Maximize caliuma dn vit D intake as well as weight bearing exercise.  RTC 1 year.

## 2024-07-31 LAB — CYTOLOGY - PAP
Comment: NEGATIVE
Diagnosis: NEGATIVE
Diagnosis: REACTIVE
High risk HPV: NEGATIVE

## 2024-08-05 ENCOUNTER — Ambulatory Visit: Admitting: Professional

## 2024-08-07 ENCOUNTER — Telehealth: Payer: Self-pay | Admitting: *Deleted

## 2024-08-07 ENCOUNTER — Encounter: Payer: Self-pay | Admitting: Podiatry

## 2024-08-07 ENCOUNTER — Ambulatory Visit

## 2024-08-07 ENCOUNTER — Ambulatory Visit: Admitting: Podiatry

## 2024-08-07 DIAGNOSIS — M722 Plantar fascial fibromatosis: Secondary | ICD-10-CM | POA: Diagnosis not present

## 2024-08-07 DIAGNOSIS — M67471 Ganglion, right ankle and foot: Secondary | ICD-10-CM | POA: Diagnosis not present

## 2024-08-07 NOTE — Progress Notes (Signed)
  Subjective:  Patient ID: Ruth Garcia, female    DOB: [birth year],   MRN: [id number]  Chief Complaint  Patient presents with   Foot Pain    Dr. Alvan sent me because of the place on the side of my right foot.  It has gotten bigger.  It's a little sore.  I notice it more by the end of the day, I'm on my feet.  I'm a Runner, broadcasting/film/video.    53 y.o. female presents for concern of concern of pain on the side of the right foot that has been present for about a month.  Relates it was first noticed by her pedicurist and dermatologist.  Denies any pain with it.  Dr. Alvan referred to evaluate further. Denies any other pedal complaints. Denies n/v/f/c.   Past Medical History:  Diagnosis Date   Allergy    Anxiety    Depression     Objective:  Physical Exam: Vascular: DP/PT pulses 2/4 bilateral. CFT <3 seconds. Normal hair growth on digits. No edema.  Skin. No lacerations or abrasions bilateral feet.  Right foot prominent fifth metatarsal styloid process with mild fluctuance noted in the area and likely cyst present.  Nonmobile nontender. Musculoskeletal: MMT 5/5 bilateral lower extremities in DF, PF, Inversion and Eversion. Deceased ROM in DF of ankle joint.  Neurological: Sensation intact to light touch.   Assessment:   1. Ganglion cyst of right foot      Plan:  Patient was evaluated and treated and all questions answered. X-rays reviewed and discussed with patient.  No acute fractures or dislocations noted there is some soft tissue edema noted in base of fifth metatarsal area.  X-rays reviewed and discussed with patient. Discussed ganglion cysts and treatment options with the patient. Discussed keeping an eye on the area for now if in the future it causes any pain would consider aspiration of the cyst. Discussed in the future surgical options may be necessary as well. Patient to return as needed.        Asberry Failing, DPM

## 2024-08-07 NOTE — Telephone Encounter (Signed)
 I attempted to call the patient to ask her if she  could come in a few minutes early for her appointment today because we need for her to go to imaging for x-rays.

## 2024-08-07 NOTE — Patient Instructions (Addendum)
 Peroneal Tendinopathy Rehab Ask your health care provider which exercises are safe for you. Do exercises exactly as told by your health care provider and adjust them as directed. It is normal to feel mild stretching, pulling, tightness, or discomfort as you do these exercises. Stop right away if you feel sudden pain or your pain gets worse. Do not begin these exercises until told by your health care provider. Stretching and range-of-motion exercises These exercises warm up your muscles and joints. They can help improve the movement and flexibility of your ankle. They may also help to relieve pain and stiffness. Gastrocnemius and soleus stretch, standing This is an exercise in which you stand on a step and use your body weight to stretch your calf muscles. To do this exercise: Stand on the edge of a step on the ball of your left / right foot. The ball of your foot is on the walking surface, right under your toes. Keep your other foot firmly on the same step. Hold on to the wall, a railing, or a chair for balance. Slowly lift your other foot, allowing your body weight to press your left / right heel down over the edge of the step. You should feel a stretch in your left / right calf (gastrocnemius and soleus). Hold this position for __________ seconds. Return both feet to the step. Repeat this exercise with a slight bend in your left / right knee. Repeat __________ times with your left / right knee straight and __________ times with your left / right knee bent. Complete this exercise __________ times a day. Strengthening exercises These exercises build strength and endurance in your foot and ankle. Endurance is the ability to use your muscles for a long time, even after they get tired. Ankle dorsiflexion with band  Secure a rubber exercise band or tube to an object, such as a table leg, that will not move when the band is pulled. Secure the other end of the band around your left / right foot. Sit on  the floor. Face the object with your left / right leg extended. The band or tube should be slightly tense when your foot is relaxed. Slowly flex your left / right ankle and toes to bring your foot toward you (dorsiflexion). Hold this position for __________ seconds. Let the band or tube slowly pull your foot back to the starting position. Repeat __________ times. Complete this exercise __________ times a day. Ankle eversion  Sit on the floor with your legs straight out in front of you. Loop a rubber exercise band or tube around the ball of your left / right foot. The ball of your foot is on the walking surface, right under your toes. Hold the ends of the band in your hands. You can also secure the band to a stable object. The band or tube should be slightly tense when your foot is relaxed. Slowly push your foot outward, away from your other leg (eversion). Hold this position for __________ seconds. Slowly return your foot to the starting position. Repeat __________ times. Complete this exercise __________ times a day. Plantar flexion, standing This exercise is sometimes called a standing heel raise. Stand with your feet shoulder-width apart. Place your hands on a wall or table to steady yourself as needed. Try not to use it for support. Keep your weight spread evenly over the width of your feet while you slowly rise up on your toes (plantar flexion). If told by your health care provider: Shift your weight  toward your left / right leg until you feel challenged. Stand on your left / right leg only. Hold this position for __________ seconds. Repeat __________ times. Complete this exercise __________ times a day. Single leg stand  Without shoes, stand near a railing or in a doorway. You may hold on to the railing or doorframe as needed. Stand on your left / right foot. Keep your big toe down on the floor and try to keep your arch lifted. Do not roll to the outside of your foot. If this  exercise is too easy, you can try it with your eyes closed or while standing on a pillow. Hold this position for __________ seconds. Repeat __________ times. Complete this exercise __________ times a day. This information is not intended to replace advice given to you by your health care provider. Make sure you discuss any questions you have with your health care provider. Document Revised: 01/26/2022 Document Reviewed: 01/26/2022 Elsevier Patient Education  2024 [employer].

## 2024-08-17 ENCOUNTER — Ambulatory Visit: Payer: Self-pay | Admitting: Obstetrics & Gynecology

## 2024-08-19 ENCOUNTER — Ambulatory Visit: Admitting: Professional

## 2024-09-02 ENCOUNTER — Ambulatory Visit: Admitting: Professional

## 2024-09-08 ENCOUNTER — Ambulatory Visit: Admitting: Professional

## 2024-09-08 ENCOUNTER — Encounter: Payer: Self-pay | Admitting: Professional

## 2024-09-08 DIAGNOSIS — F411 Generalized anxiety disorder: Secondary | ICD-10-CM | POA: Diagnosis not present

## 2024-09-08 NOTE — Progress Notes (Signed)
 [facility] Counselor/Therapist Progress Note  Patient ID: Ruth Garcia, MRN: [id number],    Date: 09/08/2024  Time Spent: 45 minutes 902-947am  Treatment Type: Individual Therapy  Risk Assessment: Danger to Self:  No Self-injurious Behavior: No Danger to Others: No  Subjective: This session was held via video teletherapy. The patient consented to video teletherapy and was located at her home room during this session. She is aware it is the responsibility of the patient to secure confidentiality on her end of the session. The provider was in a private home office for the duration of this session.    The patient arrived early for her Caregility appointment  1-mood -feels good, mood has been happy 2-son -has sought some help -he was not sleeping and had an in home study -he has mild sleep apnea and was only sleeping 3-4 hours per night -sleeping has improved and overall demeanor and attitude has changed a little bit -general atmosphere in home has improved -pt wishes the children got along better and only happens if pt facilitates 3-professional -off the entire week 4-Thanksgiving -her mother and her sister arrive today -it's her daughter's birthday 5-relapse/lapse -pt is in a good place but fears relapse -coping skills (exercise, reading, yoga, gratitude journal) -being 6-already grieving daughter moving out in spring -she is already worrying about what her life will look like -discussed potential plans for  contact  Treatment Plan Problems: Anxiety, Childhood Trauma, Low Self-Esteem, Spiritual Confusion Symptoms: Verbalization of a desire for a closer relationship to a higher power. Feelings and attitudes about a higher power that are characterized by fear, anger, and distrust. A felt need for a higher power, but because upbringing contained no religious education or training, does not know where or how to begin. Difficulty in saying no to others;  assumes not being liked by others. Fear of rejection by others, especially peer group. Description of parents as physically or emotionally neglectful as they were chemically dependent, too busy, absent, etc. Irrational fears, suppressed rage, low self-esteem, identity conflicts, depression, or anxious insecurity related to painful early life experiences. Excessive and/or unrealistic worry that is difficult to control occurring more days than not for at least 6 months about a number of events or activities. Motor tension (e.g., restlessness, tiredness, shakiness, muscle tension). Autonomic hyperactivity (e.g., palpitations, shortness of breath, dry mouth, trouble swallowing, nausea, diarrhea). Hypervigilance (e.g., feeling constantly on edge, experiencing concentration difficulties, having trouble falling or staying asleep, exhibiting a general state of irritability). Goals: Reduce overall frequency, intensity, and duration of the anxiety so that daily functioning is not impaired. Stabilize anxiety level while increasing ability to function on a daily basis. Resolve the core conflict that is the source of anxiety. Enhance ability to effectively cope with the full variety of life's worries and anxieties. Learn and implement coping skills that result in a reduction of anxiety and worry, and improved daily functioning. Resolve past childhood/family issues, leading to less anger and depression, greater self-esteem, security, and confidence. Release the emotions associated with past childhood/family issues, resulting in less resentment and more serenity. Let go of blame and begin to forgive others for pain caused in childhood. Elevate self-esteem. Develop a consistent, positive self-image. Establish an inward sense of self-worth, confidence, and competence. Demonstrate improved self-esteem through more pride in appearance, more assertiveness, greater eye contact, and identification of positive traits  in self-talk messages. Clarify spiritual concepts and instill a freedom to approach a higher power as a resource for support. Increase belief  in and development of a relationship with a higher power. Begin a faith in a higher power and incorporate it into support system. Resolve issues that have prevented faith or belief from developing and growing. Objectives target date for all objectives is 05/13/2025: Verbalize an understanding of the cognitive, physiological, and behavioral components of anxiety and its treatment.   80% Learn and implement calming skills to reduce overall anxiety and manage anxiety symptoms.   70% Verbalize an understanding of the role that cognitive biases play in excessive irrational worry and persistent anxiety symptoms.   100% Identify, challenge, and replace biased, fearful self-talk with positive, realistic, and empowering self-talk.   90% Learn and implement relapse prevention strategies for managing possible future anxiety symptoms.   75% Learn to accept limitations in life and commit to tolerating, rather than avoiding, unpleasant emotions while accomplishing meaningful goals.  70% Describe each family member and identify the role each played within the family. 0% Identify patterns of abuse, neglect, or abandonment within the family of origin, both current and historical, nuclear and extended. Identify feelings associated with major traumatic incidents in childhood and with parental child-rearing patterns. Acknowledge feeling less competent than most others.   100% happens when spiraling Decrease the frequency of negative self-descriptive statements and increase frequency of positive self-descriptive statements.   80% Identify and engage in activities that would improve self-image by being consistent with one's values.  80% Identify positive traits and talents about self.   100% Articulate a plan to be proactive in trying to get identified needs met.   90% Increase  the frequency of assertive behaviors.   90% Summarize the highlights of own spiritual quest or journey to this date.   90% Describe early life training in spiritual concepts and identify its impact on current religious beliefs.   90% Identify the difference between religion and faith.   90%    Implement daily attempts to be in contact with higher power.   90% Acknowledge the need to separate negative past experiences with religious people from the current spiritual evaluation. Ask a respected person who has apparent spiritual depth to serve as a dance movement psychotherapist. Attend groups dedicated to enriching spirituality. Read books that focus on furthering a connection with a higher power. Interventions: Explore the client's assessment of himself/herself and what is verbalized as the basis for negative self-perception. Help the client analyze his/her values and the congruence or incongruence between them and the client's daily activities. Identify and assign activities congruent with the client's values; process them toward improving self-concept and self-esteem. Assign the client the exercise of identifying his/her positive physical characteristics in a mirror to help him/her become more comfortable with himself/herself. Ask the client to keep building a list of positive traits and have him/her read the list at the beginning and end of each session (or assign Acknowledging My Strengths or What Are My Good Qualities? in the Adult Psychotherapy Homework Planner by [facility]); reinforce the client's positive self-descriptive statements. Assist the client in identifying and verbalizing his/her needs, met and unmet. Assist the client in developing a specific action plan to get each need met (or assign Satisfying Unmet Emotional Needs in the Adult Psychotherapy Homework Planner by Jenniffer). Train the client in assertiveness or refer him/her to a group that will educate and facilitate assertiveness skills via lectures  and assignments. Assist the client in becoming aware of how he/she expresses or acts out negative feelings about himself/herself. Help the client reframe his/her negative assessment of himself/herself. Ask  the client to talk about or write the story of his/her spiritual quest/journey (or assign My History of Spirituality from the Adult Psychotherapy Homework Planner by Jenniffer); process the journey material. Assist the client in evaluating religious tenets separated from painful emotional experiences with religious people in his/her past. Explore the religious distortions and judgmentalism that the client has been subjected to by others. Help the client find a mentor to guide his/her spiritual development. Make the client aware of opportunities for spiritual enrichment (e.g., Bible studies, study groups, fellowship groups); process the experiences he/she decides to pursue. Suggest that the client attend a spiritual retreat (e.g., [place] or [facility] [employer]) and report to therapist what the experience was like for him/her and what he/she gained from the experience. Ask the client to read books to cultivate his/her spirituality (e.g., The Cloister Walk by Kay; The Purpose-driven Life by Butler; The Care of the Soul by Georgina). Review the client's early life experiences surrounding belief in a higher power and explore how this affects current beliefs. Educate the client on the difference between religion and spirituality. Recommend that the client implement daily meditations and/or prayer; process the experience. Assign the client to write a daily note to his/her higher power. Encourage and assist the client in developing and implementing a daily devotional time or other ritual that will foster his/her spiritual growth. Explore the client's schema and self-talk that mediate his/her fear response; assist him/her in challenging the biases; replace the distorted messages with reality-based  alternatives and positive, realistic self-talk that will increase his/her self-confidence in coping with irrational fears (see Cognitive Therapy of Anxiety Disorders by Gretta armin Mon). Discuss with the client the distinction between a lapse and relapse, associating a lapse with an initial and reversible return of worry, anxiety symptoms, or urges to avoid, and relapse with the decision to continue the fearful and avoidant patterns. Identify and rehearse with the client the management of future situations or circumstances in which lapses could occur. Develop a coping card on which coping strategies and other important information (e.g., Breathe deeply and relax, Challenge unrealistic worries, Use problem-solving) are written for the client's later use. Use techniques from Acceptance and Commitment Therapy to help client accept uncomfortable realities such as lack of complete control, imperfections, and uncertainty and tolerate unpleasant emotions and thoughts in order to accomplish value-consistent goals. Discuss how generalized anxiety typically involves excessive worry about unrealistic threats, various bodily expressions of tension, overarousal, and hypervigilance, and avoidance of what is threatening that interact to maintain the problem (see Mastery of Your Anxiety and Worry: Therapist Guide by Venson River, and Barlow; Treating Generalized Anxiety Disorder by Rygh and Red). Assign the client to read about progressive muscle relaxation and other calming strategies in relevant books or treatment manuals (e.g., Progressive Relaxation Training by Thornell and Elmer; Mastery of Your Anxiety and Worry: Workbook by River armin Given). Discuss examples demonstrating that unrealistic worry typically overestimates the probability of threats and underestimates or overlooks the client's ability to manage realistic demands (or assign Past Successful Anxiety Coping in the Adult Psychotherapy  Homework Planner by Jenniffer). Teach the client calming/relaxation skills (e.g., applied relaxation, progressive muscle relaxation, cue controlled relaxation; mindful breathing; biofeedback) and how to discriminate better between relaxation and tension; teach the client how to apply these skills to his/her daily life (e.g., New Directions in Progressive Muscle Relaxation by Thornell Elmer, and Hazlett-Stevens; Treating Generalized Anxiety Disorder by Rygh and Red). Assist the client in analyzing his/her worries by examining potential  biases such as the probability of the negative expectation occurring, the real consequences of it occurring, his/her ability to control the outcome, the worst possible outcome, and his/her ability to accept it (see Analyze the Probability of a Feared Event in the Adult Psychotherapy Homework Planner by Jenniffer; Cognitive Therapy of Anxiety Disorders by Gretta armin Mon). Assist the client in clarifying his/her role within the family and his/her feelings connected to that role. Assign the client to ask parents about their family backgrounds and develop insight regarding patterns of behavior and causes for parents' dysfunction. Explore the client's painful childhood experiences (or assign Share the Painful Memory in the Adult Psychotherapy Homework Planner by Jenniffer). Support and encourage the client when he/she begins to express feelings of rage, sadness, fear, and rejection relating to family abuse or neglect. Assign the client to record feelings in a journal that describes memories, behavior, and emotions tied to his/her traumatic childhood experiences (or assign How the Trauma Affects Me in the Adult Psychotherapy Homework Planner by Jenniffer).  Diagnosis:Generalized anxiety disorder  Plan:  -meet again on Tuesday, October 06, 2024 at [employer]

## 2024-09-16 ENCOUNTER — Ambulatory Visit: Admitting: Professional

## 2024-09-30 ENCOUNTER — Ambulatory Visit: Admitting: Professional

## 2024-10-06 ENCOUNTER — Ambulatory Visit: Admitting: Professional

## 2024-10-16 ENCOUNTER — Encounter: Payer: Self-pay | Admitting: Obstetrics & Gynecology

## 2024-10-23 ENCOUNTER — Other Ambulatory Visit: Payer: Self-pay | Admitting: Obstetrics & Gynecology

## 2024-10-23 MED ORDER — ALPRAZOLAM 0.5 MG PO TABS: ORAL_TABLET | ORAL | 0 refills | Status: AC

## 2024-10-23 NOTE — Progress Notes (Signed)
Xanax prior to MRI

## 2024-10-27 ENCOUNTER — Other Ambulatory Visit: Payer: Self-pay

## 2024-10-27 DIAGNOSIS — Z803 Family history of malignant neoplasm of breast: Secondary | ICD-10-CM

## 2024-10-28 ENCOUNTER — Ambulatory Visit: Admitting: Professional

## 2024-11-11 ENCOUNTER — Ambulatory Visit: Admitting: Professional

## 2024-11-12 ENCOUNTER — Ambulatory Visit: Admitting: Family Medicine

## 2024-11-12 NOTE — Progress Notes (Unsigned)
" ° °  Established Patient Office Visit  Patient ID: Ruth Garcia, female    DOB: 07-24-1971  Age: 54 y.o. MRN: [id number] PCP: Alvan Dorothyann BIRCH, MD  No chief complaint on file.   Subjective:     HPI  Discussed the use of AI scribe software for clinical note transcription with the patient, who gave verbal consent to proceed.  History of Present Illness    {History (Optional):23778}  ROS    Objective:     There were no vitals taken for this visit. {Vitals History (Optional):23777}  Physical Exam  {PhysExam Abridge (Optional):210964309} No results found for any visits on 11/12/24.  {Labs (Optional):23779}  The 10-year ASCVD risk score (Arnett DK, et al., 2019) is: 1.5%    Assessment & Plan:   Problem List Items Addressed This Visit       Other   Depression with anxiety - Primary    Assessment and Plan Assessment & Plan     No follow-ups on file.    Dorothyann Alvan, MD [facility] at [facility]   "

## 2024-11-13 ENCOUNTER — Ambulatory Visit: Admitting: Family Medicine

## 2024-11-13 ENCOUNTER — Encounter: Payer: Self-pay | Admitting: Family Medicine

## 2024-11-13 VITALS — BP 115/68 | HR 66 | Wt 157.0 lb

## 2024-11-13 DIAGNOSIS — R590 Localized enlarged lymph nodes: Secondary | ICD-10-CM | POA: Diagnosis not present

## 2024-11-13 DIAGNOSIS — F418 Other specified anxiety disorders: Secondary | ICD-10-CM | POA: Diagnosis not present

## 2024-11-13 DIAGNOSIS — F4329 Adjustment disorder with other symptoms: Secondary | ICD-10-CM | POA: Diagnosis not present

## 2024-11-13 MED ORDER — CITALOPRAM HYDROBROMIDE 20 MG PO TABS: 20.0000 mg | ORAL_TABLET | Freq: Every day | ORAL | 3 refills | Status: AC

## 2024-11-13 NOTE — Progress Notes (Signed)
 "  Established Patient Office Visit  Patient ID: Ruth Garcia, female    DOB: Feb 26, 1971  Age: 54 y.o. MRN: [id number] PCP: Alvan Dorothyann BIRCH, MD  Chief Complaint  Patient presents with   Medical Management of Chronic Issues    Subjective:     HPI  Discussed the use of AI scribe software for clinical note transcription with the patient, who gave verbal consent to proceed.  History of Present Illness Ruth Garcia is a 54 year old female who presents for a routine follow-up visit.  Sleep disturbance - Consistent early morning awakenings at 4 AM, regardless of bedtime - Adjusted sleep schedule by going to bed earlier to accommodate this pattern  Medication management - Currently taking citalopram  - Requires refill of citalopram   Preventive care and screening - Breast MRI scheduled for the 21st of next month  Physical activity - Started attending Pilates classes at a new studio in [place] - Attends classes at 5 AM before work - [facility] the activity enjoyable and a positive change from previous exercise routine  Constitutional and cardiopulmonary symptoms - No recent chest pain - No breathing problems - No neck discomfort - No scratchy throat - No issues with swallowing     ROS    Objective:     BP 115/68   Pulse 66   Wt 157 lb (71.2 kg)   SpO2 100%   BMI 25.34 kg/m    Physical Exam Vitals and nursing note reviewed.  Constitutional:      Appearance: Normal appearance.  HENT:     Head: Normocephalic and atraumatic.  Eyes:     Conjunctiva/sclera: Conjunctivae normal.  Neck:      Comments: 1 cm oval shaped LN that is mobile, smooth and firm.  Cardiovascular:     Rate and Rhythm: Normal rate and regular rhythm.  Pulmonary:     Effort: Pulmonary effort is normal.     Breath sounds: Normal breath sounds.  Skin:    General: Skin is warm and dry.  Neurological:     Mental Status: She is alert.  Psychiatric:        Mood and Affect: Mood  normal.      No results found for any visits on 11/13/24.    The 10-year ASCVD risk score (Arnett DK, et al., 2019) is: 1.3%    Assessment & Plan:   Problem List Items Addressed This Visit       Other   Stress and adjustment reaction   Relevant Medications   citalopram  (CELEXA ) 20 MG tablet   Depression with anxiety - Primary   Relevant Medications   citalopram  (CELEXA ) 20 MG tablet   Other Visit Diagnoses       Enlarged lymph node in neck           Assessment and Plan Assessment & Plan Depression with anxiety No recent exacerbations. - Continue current medication regimen. - Sent updated prescription for Celexa  . - Schedule follow-up in July.  Localized enlarged lymph node 1 cm, oval, mobile lymph node with no change in size or symptoms. - Stable, no recent change  General Health Maintenance Discussed Shingrix vaccination, recommended for those over 50 due to high efficacy and insurance coverage. Potential side effects include fatigue and body aches, similar to the flu, lasting 24-48 hours. - Consider Shingrix vaccination, schedule second dose on weekend if possible.    No follow-ups on file.    Dorothyann Alvan, MD [facility]  Medicine at [facility]   "

## 2024-12-06 ENCOUNTER — Other Ambulatory Visit

## 2024-12-09 ENCOUNTER — Ambulatory Visit: Admitting: Professional

## 2025-05-25 ENCOUNTER — Encounter: Admitting: Family Medicine
